# Patient Record
Sex: Female | Born: 1935 | Race: White | Hispanic: No | Marital: Married | Smoking: Never smoker
Health system: Southern US, Community
[De-identification: ages and names within clinical notes are randomized; demographics above are authoritative.]

## PROBLEM LIST (undated history)

## (undated) DIAGNOSIS — R011 Cardiac murmur, unspecified: Secondary | ICD-10-CM

## (undated) DIAGNOSIS — K219 Gastro-esophageal reflux disease without esophagitis: Secondary | ICD-10-CM

## (undated) DIAGNOSIS — I350 Nonrheumatic aortic (valve) stenosis: Secondary | ICD-10-CM

## (undated) DIAGNOSIS — I10 Essential (primary) hypertension: Secondary | ICD-10-CM

## (undated) DIAGNOSIS — R001 Bradycardia, unspecified: Secondary | ICD-10-CM

## (undated) DIAGNOSIS — H5440 Blindness, one eye, unspecified eye: Secondary | ICD-10-CM

## (undated) DIAGNOSIS — J45909 Unspecified asthma, uncomplicated: Secondary | ICD-10-CM

## (undated) DIAGNOSIS — M1710 Unilateral primary osteoarthritis, unspecified knee: Secondary | ICD-10-CM

## (undated) DIAGNOSIS — E119 Type 2 diabetes mellitus without complications: Secondary | ICD-10-CM

## (undated) DIAGNOSIS — J40 Bronchitis, not specified as acute or chronic: Secondary | ICD-10-CM

## (undated) HISTORY — DX: Bronchitis, not specified as acute or chronic: J40

## (undated) HISTORY — DX: Unilateral primary osteoarthritis, unspecified knee: M17.10

## (undated) HISTORY — DX: Bradycardia, unspecified: R00.1

## (undated) HISTORY — DX: Nonrheumatic aortic (valve) stenosis: I35.0

## (undated) HISTORY — PX: OTHER SURGICAL HISTORY: SHX169

## (undated) HISTORY — DX: Essential (primary) hypertension: I10

## (undated) HISTORY — PX: PACEMAKER INSERTION: SHX728

## (undated) HISTORY — DX: Unspecified asthma, uncomplicated: J45.909

## (undated) HISTORY — PX: CATARACT EXTRACTION: SUR2

## (undated) HISTORY — DX: Blindness, one eye, unspecified eye: H54.40

## (undated) HISTORY — DX: Gastro-esophageal reflux disease without esophagitis: K21.9

## (undated) HISTORY — DX: Cardiac murmur, unspecified: R01.1

## (undated) HISTORY — PX: DILATION AND CURETTAGE OF UTERUS: SHX78

## (undated) HISTORY — DX: Type 2 diabetes mellitus without complications: E11.9

---

## 1997-05-29 ENCOUNTER — Other Ambulatory Visit: Admission: RE | Admit: 1997-05-29 | Discharge: 1997-05-29 | Payer: Self-pay | Admitting: Gastroenterology

## 1999-01-20 ENCOUNTER — Other Ambulatory Visit: Admission: RE | Admit: 1999-01-20 | Discharge: 1999-01-20 | Payer: Self-pay | Admitting: Obstetrics and Gynecology

## 2000-05-14 ENCOUNTER — Other Ambulatory Visit: Admission: RE | Admit: 2000-05-14 | Discharge: 2000-05-14 | Payer: Self-pay | Admitting: Obstetrics and Gynecology

## 2002-02-07 ENCOUNTER — Encounter: Payer: Self-pay | Admitting: Orthopedic Surgery

## 2002-02-17 ENCOUNTER — Inpatient Hospital Stay (HOSPITAL_COMMUNITY): Admission: RE | Admit: 2002-02-17 | Discharge: 2002-02-20 | Payer: Self-pay | Admitting: Orthopedic Surgery

## 2002-02-20 ENCOUNTER — Inpatient Hospital Stay (HOSPITAL_COMMUNITY)
Admission: RE | Admit: 2002-02-20 | Discharge: 2002-03-06 | Payer: Self-pay | Admitting: Physical Medicine & Rehabilitation

## 2002-05-26 ENCOUNTER — Inpatient Hospital Stay (HOSPITAL_COMMUNITY): Admission: RE | Admit: 2002-05-26 | Discharge: 2002-05-27 | Payer: Self-pay | Admitting: Orthopedic Surgery

## 2004-02-15 ENCOUNTER — Ambulatory Visit: Payer: Self-pay | Admitting: Internal Medicine

## 2004-03-01 ENCOUNTER — Ambulatory Visit: Payer: Self-pay | Admitting: Internal Medicine

## 2004-03-02 ENCOUNTER — Ambulatory Visit: Payer: Self-pay | Admitting: Internal Medicine

## 2004-03-11 ENCOUNTER — Ambulatory Visit: Payer: Self-pay | Admitting: Internal Medicine

## 2004-03-25 ENCOUNTER — Ambulatory Visit: Payer: Self-pay | Admitting: Internal Medicine

## 2004-04-13 ENCOUNTER — Ambulatory Visit: Payer: Self-pay | Admitting: Internal Medicine

## 2004-04-28 ENCOUNTER — Ambulatory Visit: Payer: Self-pay | Admitting: Internal Medicine

## 2004-05-12 ENCOUNTER — Ambulatory Visit: Payer: Self-pay | Admitting: Internal Medicine

## 2004-05-26 ENCOUNTER — Ambulatory Visit: Payer: Self-pay | Admitting: Internal Medicine

## 2004-06-10 ENCOUNTER — Ambulatory Visit: Payer: Self-pay | Admitting: Internal Medicine

## 2004-06-29 ENCOUNTER — Ambulatory Visit: Payer: Self-pay | Admitting: Internal Medicine

## 2004-07-13 ENCOUNTER — Ambulatory Visit: Payer: Self-pay | Admitting: Internal Medicine

## 2004-07-27 ENCOUNTER — Ambulatory Visit: Payer: Self-pay | Admitting: Internal Medicine

## 2004-08-07 ENCOUNTER — Emergency Department (HOSPITAL_COMMUNITY): Admission: EM | Admit: 2004-08-07 | Discharge: 2004-08-07 | Payer: Self-pay | Admitting: Emergency Medicine

## 2004-08-12 ENCOUNTER — Ambulatory Visit: Payer: Self-pay | Admitting: Internal Medicine

## 2004-08-30 ENCOUNTER — Ambulatory Visit: Payer: Self-pay | Admitting: Internal Medicine

## 2004-09-13 ENCOUNTER — Ambulatory Visit: Payer: Self-pay | Admitting: Internal Medicine

## 2004-09-27 ENCOUNTER — Ambulatory Visit: Payer: Self-pay | Admitting: Internal Medicine

## 2004-10-12 ENCOUNTER — Other Ambulatory Visit: Admission: RE | Admit: 2004-10-12 | Discharge: 2004-10-12 | Payer: Self-pay | Admitting: Obstetrics and Gynecology

## 2004-10-17 ENCOUNTER — Ambulatory Visit: Payer: Self-pay | Admitting: Internal Medicine

## 2004-10-31 ENCOUNTER — Ambulatory Visit: Payer: Self-pay | Admitting: Internal Medicine

## 2004-11-02 ENCOUNTER — Ambulatory Visit: Payer: Self-pay | Admitting: Internal Medicine

## 2004-11-14 ENCOUNTER — Ambulatory Visit: Payer: Self-pay | Admitting: Internal Medicine

## 2004-12-06 ENCOUNTER — Ambulatory Visit: Payer: Self-pay | Admitting: Internal Medicine

## 2004-12-20 ENCOUNTER — Ambulatory Visit: Payer: Self-pay | Admitting: Internal Medicine

## 2005-01-03 ENCOUNTER — Ambulatory Visit: Payer: Self-pay | Admitting: Internal Medicine

## 2005-01-17 ENCOUNTER — Ambulatory Visit: Payer: Self-pay | Admitting: Internal Medicine

## 2005-01-31 ENCOUNTER — Ambulatory Visit: Payer: Self-pay | Admitting: Internal Medicine

## 2005-02-16 ENCOUNTER — Ambulatory Visit: Payer: Self-pay | Admitting: Emergency Medicine

## 2005-02-16 ENCOUNTER — Ambulatory Visit: Payer: Self-pay | Admitting: Internal Medicine

## 2005-02-21 ENCOUNTER — Ambulatory Visit: Payer: Self-pay | Admitting: Internal Medicine

## 2005-03-03 ENCOUNTER — Ambulatory Visit: Payer: Self-pay | Admitting: Internal Medicine

## 2005-03-24 ENCOUNTER — Ambulatory Visit: Payer: Self-pay | Admitting: Internal Medicine

## 2005-04-03 ENCOUNTER — Ambulatory Visit: Payer: Self-pay | Admitting: Pulmonary Disease

## 2005-04-03 ENCOUNTER — Ambulatory Visit: Payer: Self-pay | Admitting: Internal Medicine

## 2005-04-13 ENCOUNTER — Ambulatory Visit: Payer: Self-pay | Admitting: Internal Medicine

## 2005-04-26 ENCOUNTER — Ambulatory Visit: Payer: Self-pay | Admitting: Internal Medicine

## 2005-05-01 ENCOUNTER — Ambulatory Visit (HOSPITAL_COMMUNITY): Admission: RE | Admit: 2005-05-01 | Discharge: 2005-05-01 | Payer: Self-pay | Admitting: Gastroenterology

## 2005-05-02 ENCOUNTER — Encounter: Admission: RE | Admit: 2005-05-02 | Discharge: 2005-05-02 | Payer: Self-pay | Admitting: Gastroenterology

## 2005-05-05 ENCOUNTER — Ambulatory Visit: Payer: Self-pay

## 2005-05-15 ENCOUNTER — Ambulatory Visit: Payer: Self-pay | Admitting: Internal Medicine

## 2005-06-05 ENCOUNTER — Ambulatory Visit: Payer: Self-pay | Admitting: Internal Medicine

## 2005-06-23 ENCOUNTER — Ambulatory Visit: Payer: Self-pay | Admitting: Internal Medicine

## 2005-07-05 ENCOUNTER — Ambulatory Visit: Payer: Self-pay | Admitting: Internal Medicine

## 2005-07-25 ENCOUNTER — Ambulatory Visit: Payer: Self-pay | Admitting: Internal Medicine

## 2005-07-26 ENCOUNTER — Ambulatory Visit: Payer: Self-pay | Admitting: Internal Medicine

## 2005-08-07 ENCOUNTER — Ambulatory Visit: Payer: Self-pay | Admitting: Internal Medicine

## 2005-08-17 ENCOUNTER — Ambulatory Visit: Payer: Self-pay | Admitting: Internal Medicine

## 2005-09-05 ENCOUNTER — Ambulatory Visit: Payer: Self-pay | Admitting: Internal Medicine

## 2005-09-11 ENCOUNTER — Ambulatory Visit: Payer: Self-pay | Admitting: Internal Medicine

## 2005-09-27 ENCOUNTER — Ambulatory Visit: Payer: Self-pay | Admitting: Internal Medicine

## 2005-10-11 ENCOUNTER — Ambulatory Visit: Payer: Self-pay | Admitting: Internal Medicine

## 2005-10-24 ENCOUNTER — Ambulatory Visit: Payer: Self-pay | Admitting: Internal Medicine

## 2005-11-07 ENCOUNTER — Ambulatory Visit: Payer: Self-pay | Admitting: Internal Medicine

## 2005-11-21 ENCOUNTER — Ambulatory Visit: Payer: Self-pay | Admitting: Internal Medicine

## 2005-12-06 ENCOUNTER — Ambulatory Visit: Payer: Self-pay | Admitting: Internal Medicine

## 2005-12-21 ENCOUNTER — Ambulatory Visit: Payer: Self-pay | Admitting: Internal Medicine

## 2005-12-25 ENCOUNTER — Ambulatory Visit: Payer: Self-pay | Admitting: Pulmonary Disease

## 2006-01-04 ENCOUNTER — Ambulatory Visit: Payer: Self-pay | Admitting: Internal Medicine

## 2006-01-19 ENCOUNTER — Ambulatory Visit: Payer: Self-pay | Admitting: Internal Medicine

## 2006-01-25 ENCOUNTER — Ambulatory Visit: Payer: Self-pay | Admitting: Cardiology

## 2006-02-08 ENCOUNTER — Ambulatory Visit: Payer: Self-pay | Admitting: Internal Medicine

## 2006-02-20 ENCOUNTER — Ambulatory Visit: Payer: Self-pay | Admitting: Internal Medicine

## 2006-02-28 ENCOUNTER — Ambulatory Visit: Payer: Self-pay

## 2006-02-28 ENCOUNTER — Encounter: Payer: Self-pay | Admitting: Cardiovascular Disease

## 2006-03-12 ENCOUNTER — Ambulatory Visit: Payer: Self-pay | Admitting: Internal Medicine

## 2006-03-27 ENCOUNTER — Ambulatory Visit: Payer: Self-pay | Admitting: Internal Medicine

## 2006-04-04 ENCOUNTER — Ambulatory Visit: Payer: Self-pay | Admitting: Internal Medicine

## 2006-04-11 ENCOUNTER — Ambulatory Visit: Payer: Self-pay | Admitting: Internal Medicine

## 2006-04-25 ENCOUNTER — Ambulatory Visit: Payer: Self-pay | Admitting: Internal Medicine

## 2006-05-08 ENCOUNTER — Ambulatory Visit: Payer: Self-pay | Admitting: Internal Medicine

## 2006-05-25 ENCOUNTER — Ambulatory Visit: Payer: Self-pay | Admitting: Internal Medicine

## 2006-06-14 ENCOUNTER — Ambulatory Visit: Payer: Self-pay | Admitting: Internal Medicine

## 2006-06-27 ENCOUNTER — Ambulatory Visit: Payer: Self-pay | Admitting: Internal Medicine

## 2006-06-29 ENCOUNTER — Ambulatory Visit: Payer: Self-pay | Admitting: Internal Medicine

## 2006-07-13 ENCOUNTER — Ambulatory Visit: Payer: Self-pay | Admitting: Internal Medicine

## 2006-08-03 ENCOUNTER — Ambulatory Visit: Payer: Self-pay | Admitting: Internal Medicine

## 2006-08-17 ENCOUNTER — Ambulatory Visit: Payer: Self-pay | Admitting: Internal Medicine

## 2006-08-24 ENCOUNTER — Ambulatory Visit: Payer: Self-pay | Admitting: Internal Medicine

## 2006-09-10 ENCOUNTER — Ambulatory Visit: Payer: Self-pay | Admitting: Internal Medicine

## 2006-09-25 ENCOUNTER — Ambulatory Visit: Payer: Self-pay | Admitting: Internal Medicine

## 2006-10-11 ENCOUNTER — Ambulatory Visit: Payer: Self-pay | Admitting: Internal Medicine

## 2006-10-25 ENCOUNTER — Ambulatory Visit: Payer: Self-pay | Admitting: Internal Medicine

## 2006-11-06 ENCOUNTER — Ambulatory Visit: Payer: Self-pay | Admitting: Internal Medicine

## 2006-11-19 ENCOUNTER — Ambulatory Visit: Payer: Self-pay | Admitting: Internal Medicine

## 2006-12-05 ENCOUNTER — Ambulatory Visit: Payer: Self-pay | Admitting: Internal Medicine

## 2006-12-20 ENCOUNTER — Ambulatory Visit: Payer: Self-pay | Admitting: Internal Medicine

## 2007-01-11 ENCOUNTER — Ambulatory Visit: Payer: Self-pay | Admitting: Internal Medicine

## 2007-01-29 ENCOUNTER — Ambulatory Visit: Payer: Self-pay | Admitting: Internal Medicine

## 2007-02-13 ENCOUNTER — Ambulatory Visit: Payer: Self-pay | Admitting: Internal Medicine

## 2007-03-01 ENCOUNTER — Ambulatory Visit: Payer: Self-pay | Admitting: Internal Medicine

## 2007-03-14 ENCOUNTER — Ambulatory Visit: Payer: Self-pay | Admitting: Internal Medicine

## 2007-04-01 ENCOUNTER — Ambulatory Visit: Payer: Self-pay | Admitting: Internal Medicine

## 2007-04-24 ENCOUNTER — Ambulatory Visit: Payer: Self-pay | Admitting: Internal Medicine

## 2007-05-09 ENCOUNTER — Ambulatory Visit: Payer: Self-pay | Admitting: Internal Medicine

## 2007-05-29 ENCOUNTER — Ambulatory Visit: Payer: Self-pay | Admitting: Internal Medicine

## 2007-06-10 ENCOUNTER — Ambulatory Visit: Payer: Self-pay | Admitting: Internal Medicine

## 2007-06-11 ENCOUNTER — Telehealth (INDEPENDENT_AMBULATORY_CARE_PROVIDER_SITE_OTHER): Payer: Self-pay | Admitting: *Deleted

## 2007-06-13 ENCOUNTER — Encounter: Payer: Self-pay | Admitting: Internal Medicine

## 2007-06-13 DIAGNOSIS — J209 Acute bronchitis, unspecified: Secondary | ICD-10-CM

## 2007-06-13 DIAGNOSIS — E119 Type 2 diabetes mellitus without complications: Secondary | ICD-10-CM

## 2007-06-13 DIAGNOSIS — Z8679 Personal history of other diseases of the circulatory system: Secondary | ICD-10-CM | POA: Insufficient documentation

## 2007-06-13 DIAGNOSIS — K219 Gastro-esophageal reflux disease without esophagitis: Secondary | ICD-10-CM

## 2007-06-13 DIAGNOSIS — I10 Essential (primary) hypertension: Secondary | ICD-10-CM | POA: Insufficient documentation

## 2007-06-16 DIAGNOSIS — J42 Unspecified chronic bronchitis: Secondary | ICD-10-CM

## 2007-06-17 ENCOUNTER — Telehealth (INDEPENDENT_AMBULATORY_CARE_PROVIDER_SITE_OTHER): Payer: Self-pay | Admitting: *Deleted

## 2007-06-27 ENCOUNTER — Ambulatory Visit: Payer: Self-pay | Admitting: Internal Medicine

## 2007-06-28 ENCOUNTER — Telehealth: Payer: Self-pay | Admitting: Internal Medicine

## 2007-07-11 ENCOUNTER — Ambulatory Visit: Payer: Self-pay | Admitting: Internal Medicine

## 2007-07-31 ENCOUNTER — Ambulatory Visit: Payer: Self-pay | Admitting: Internal Medicine

## 2007-08-13 ENCOUNTER — Ambulatory Visit: Payer: Self-pay | Admitting: Internal Medicine

## 2007-09-03 ENCOUNTER — Ambulatory Visit: Payer: Self-pay | Admitting: Internal Medicine

## 2007-09-18 ENCOUNTER — Ambulatory Visit: Payer: Self-pay | Admitting: Internal Medicine

## 2007-10-03 ENCOUNTER — Ambulatory Visit: Payer: Self-pay | Admitting: Internal Medicine

## 2007-10-21 ENCOUNTER — Ambulatory Visit: Payer: Self-pay | Admitting: Internal Medicine

## 2007-11-11 ENCOUNTER — Ambulatory Visit: Payer: Self-pay | Admitting: Pulmonary Disease

## 2007-11-11 ENCOUNTER — Ambulatory Visit: Payer: Self-pay | Admitting: Internal Medicine

## 2007-11-18 ENCOUNTER — Ambulatory Visit: Payer: Self-pay | Admitting: Internal Medicine

## 2007-12-02 ENCOUNTER — Ambulatory Visit: Payer: Self-pay | Admitting: Internal Medicine

## 2007-12-10 ENCOUNTER — Ambulatory Visit: Payer: Self-pay | Admitting: Internal Medicine

## 2007-12-16 ENCOUNTER — Ambulatory Visit: Payer: Self-pay | Admitting: Internal Medicine

## 2007-12-30 ENCOUNTER — Ambulatory Visit: Payer: Self-pay | Admitting: Internal Medicine

## 2008-01-13 ENCOUNTER — Ambulatory Visit: Payer: Self-pay | Admitting: Internal Medicine

## 2008-01-19 ENCOUNTER — Encounter: Payer: Self-pay | Admitting: Internal Medicine

## 2008-01-27 ENCOUNTER — Ambulatory Visit: Payer: Self-pay | Admitting: Internal Medicine

## 2008-02-11 ENCOUNTER — Ambulatory Visit: Payer: Self-pay | Admitting: Internal Medicine

## 2008-02-25 ENCOUNTER — Ambulatory Visit: Payer: Self-pay | Admitting: Internal Medicine

## 2008-03-12 ENCOUNTER — Ambulatory Visit: Payer: Self-pay | Admitting: Internal Medicine

## 2008-03-26 ENCOUNTER — Ambulatory Visit: Payer: Self-pay | Admitting: Internal Medicine

## 2008-04-22 ENCOUNTER — Ambulatory Visit: Payer: Self-pay | Admitting: Internal Medicine

## 2008-05-06 ENCOUNTER — Ambulatory Visit: Payer: Self-pay | Admitting: Internal Medicine

## 2008-05-15 ENCOUNTER — Other Ambulatory Visit: Admission: RE | Admit: 2008-05-15 | Discharge: 2008-05-15 | Payer: Self-pay | Admitting: Gastroenterology

## 2008-05-21 ENCOUNTER — Ambulatory Visit: Payer: Self-pay | Admitting: Internal Medicine

## 2008-06-08 ENCOUNTER — Encounter: Payer: Self-pay | Admitting: Internal Medicine

## 2008-06-08 ENCOUNTER — Ambulatory Visit: Payer: Self-pay | Admitting: Internal Medicine

## 2008-06-21 ENCOUNTER — Encounter: Payer: Self-pay | Admitting: Internal Medicine

## 2008-07-07 ENCOUNTER — Ambulatory Visit: Payer: Self-pay | Admitting: Internal Medicine

## 2008-10-09 ENCOUNTER — Telehealth: Payer: Self-pay | Admitting: Internal Medicine

## 2008-12-07 ENCOUNTER — Ambulatory Visit: Payer: Self-pay | Admitting: Internal Medicine

## 2009-02-06 HISTORY — PX: INSERT / REPLACE / REMOVE PACEMAKER: SUR710

## 2009-07-02 ENCOUNTER — Ambulatory Visit: Payer: Self-pay | Admitting: Internal Medicine

## 2009-07-06 ENCOUNTER — Telehealth: Payer: Self-pay | Admitting: Internal Medicine

## 2009-07-06 ENCOUNTER — Ambulatory Visit: Payer: Self-pay | Admitting: Internal Medicine

## 2009-07-06 ENCOUNTER — Ambulatory Visit: Payer: Self-pay

## 2009-07-06 LAB — CONVERTED CEMR LAB
BUN: 20 mg/dL (ref 6–23)
CO2: 26 meq/L (ref 19–32)
Chloride: 101 meq/L (ref 96–112)
Creatinine, Ser: 1 mg/dL (ref 0.4–1.2)
Potassium: 4.5 meq/L (ref 3.5–5.1)
Sodium: 134 meq/L — ABNORMAL LOW (ref 135–145)

## 2009-07-07 ENCOUNTER — Telehealth: Payer: Self-pay | Admitting: Internal Medicine

## 2009-07-15 ENCOUNTER — Telehealth: Payer: Self-pay | Admitting: Internal Medicine

## 2009-07-15 ENCOUNTER — Ambulatory Visit: Payer: Self-pay | Admitting: Internal Medicine

## 2009-07-15 DIAGNOSIS — I2510 Atherosclerotic heart disease of native coronary artery without angina pectoris: Secondary | ICD-10-CM | POA: Insufficient documentation

## 2009-07-15 LAB — CONVERTED CEMR LAB
Basophils Absolute: 0 10*3/uL (ref 0.0–0.1)
Basophils Relative: 0.5 % (ref 0.0–3.0)
Eosinophils Relative: 0.4 % (ref 0.0–5.0)
HCT: 31 % — ABNORMAL LOW (ref 36.0–46.0)
Lymphs Abs: 1.9 10*3/uL (ref 0.7–4.0)
MCV: 82.8 fL (ref 78.0–100.0)
Monocytes Absolute: 0.8 10*3/uL (ref 0.1–1.0)
Neutro Abs: 6.2 10*3/uL (ref 1.4–7.7)
Platelets: 368 10*3/uL (ref 150.0–400.0)
WBC: 8.9 10*3/uL (ref 4.5–10.5)

## 2009-07-16 ENCOUNTER — Inpatient Hospital Stay (HOSPITAL_COMMUNITY): Admission: EM | Admit: 2009-07-16 | Discharge: 2009-07-21 | Payer: Self-pay | Admitting: Emergency Medicine

## 2009-07-16 ENCOUNTER — Encounter: Payer: Self-pay | Admitting: Cardiology

## 2009-07-16 ENCOUNTER — Ambulatory Visit: Payer: Self-pay | Admitting: Cardiology

## 2009-07-16 ENCOUNTER — Telehealth: Payer: Self-pay | Admitting: Internal Medicine

## 2009-07-20 ENCOUNTER — Encounter: Payer: Self-pay | Admitting: Cardiology

## 2009-07-21 ENCOUNTER — Encounter: Payer: Self-pay | Admitting: Cardiology

## 2009-07-22 ENCOUNTER — Encounter: Payer: Self-pay | Admitting: Cardiology

## 2009-07-29 ENCOUNTER — Encounter: Payer: Self-pay | Admitting: Internal Medicine

## 2009-08-02 ENCOUNTER — Ambulatory Visit: Payer: Self-pay

## 2009-08-02 ENCOUNTER — Encounter: Payer: Self-pay | Admitting: Cardiology

## 2009-08-17 ENCOUNTER — Telehealth: Payer: Self-pay | Admitting: Cardiology

## 2009-08-23 ENCOUNTER — Ambulatory Visit: Payer: Self-pay | Admitting: Cardiology

## 2009-08-23 DIAGNOSIS — Z950 Presence of cardiac pacemaker: Secondary | ICD-10-CM | POA: Insufficient documentation

## 2009-08-23 DIAGNOSIS — I5031 Acute diastolic (congestive) heart failure: Secondary | ICD-10-CM

## 2009-09-24 ENCOUNTER — Encounter: Payer: Self-pay | Admitting: Cardiology

## 2009-09-28 ENCOUNTER — Telehealth: Payer: Self-pay | Admitting: Cardiology

## 2009-10-19 ENCOUNTER — Telehealth (INDEPENDENT_AMBULATORY_CARE_PROVIDER_SITE_OTHER): Payer: Self-pay | Admitting: *Deleted

## 2009-10-21 ENCOUNTER — Ambulatory Visit: Payer: Self-pay | Admitting: Cardiology

## 2009-10-21 DIAGNOSIS — I359 Nonrheumatic aortic valve disorder, unspecified: Secondary | ICD-10-CM | POA: Insufficient documentation

## 2009-10-27 ENCOUNTER — Telehealth: Payer: Self-pay | Admitting: Cardiology

## 2009-11-12 ENCOUNTER — Encounter: Payer: Self-pay | Admitting: Internal Medicine

## 2010-02-08 ENCOUNTER — Encounter: Payer: Self-pay | Admitting: Cardiology

## 2010-02-08 ENCOUNTER — Ambulatory Visit
Admission: RE | Admit: 2010-02-08 | Discharge: 2010-02-08 | Payer: Self-pay | Source: Home / Self Care | Attending: Cardiology | Admitting: Cardiology

## 2010-03-08 NOTE — Progress Notes (Signed)
Summary: refill on nebulizer med  Phone Note Call from Patient Call back at [phone]   Caller: Patient Call For: Dr. Maple Hudson Summary of Call: [phone] Pt called to review her appts for today. CT Chest PE Protocol scheduled today at [facility] at 1:30, to be followed by the Bilateral Venous Doppler at 2:30. Pt also advised that she is out of her nebulizer medication which she gets from [facility]. Need new Rx sent to [facility]. Please advise Initial call taken by: Alfonso Ramus,  Jul 06, 2009 10:47 AM  Follow-up for Phone Call        [facility].Carron Curie CMA  Jul 06, 2009 11:18 AM   CDY; can we please give RX for this for pt.Thanks.Reynaldo Minium CMA  Jul 06, 2009 5:05 PM     Prescriptions: XOPENEX HOME NEB 1.25 MG USE TID PRN three times a day as needed  #50 x prn   Entered and Authorized by:   Waymon Budge MD   Signed by:   Waymon Budge MD on 07/06/2009   Method used:   Print then Give to Patient   RxID:   [id number]   Appended Document: refill on nebulizer med [facility] or any home care companies do not supply the xopenex neb meds any longer. This must be sent to pt's local pharmacy.

## 2010-03-08 NOTE — Letter (Signed)
Summary: CMN for Nebulizer/[facility]  CMN for Nebulizer/[facility]   Imported By: Sherian Rein 08/11/2009 13:41:34  _____________________________________________________________________  External Attachment:    Type:   Image     Comment:   External Document

## 2010-03-08 NOTE — Progress Notes (Signed)
Summary: meds  Phone Note Call from Patient Call back at Home Phone [phone]   Caller: Patient Reason for Call: Talk to Nurse Summary of Call: pt experiencing sob - wants to know if Neb Meds have been ordered? Initial call taken by: Eugene Gavia,  July 07, 2009 1:58 PM  Follow-up for Phone Call        looks like nebs need to go through pt localpharmacy. pt requests it be sent to [facility]. Pt also states she does not think she has any tubing to go with her neb machine so she requests an order for this be sent to [facility]. order placed. pt aware. Carron Curie CMA  July 07, 2009 2:40 PM     New/Updated Medications: XOPENEX 1.25 MG/3ML NEBU (LEVALBUTEROL HCL) three times a day as needed Prescriptions: XOPENEX 1.25 MG/3ML NEBU (LEVALBUTEROL HCL) three times a day as needed  #50 x prn   Entered by:   Carron Curie CMA   Authorized by:   Waymon Budge MD   Signed by:   Carron Curie CMA on 07/07/2009   Method used:   Electronically to        [facility]* (retail)       [address] [place], [place]  [address]       Ph: [phone] or [phone]       Fax: [phone]   RxID:   [id number]

## 2010-03-08 NOTE — Progress Notes (Signed)
Summary: I called to check and discuss labs  Phone Note Other Incoming   Summary of Call: I called to f/u. She notes some ongong dyspnea, not dramatic. Dr Pete Glatter did some more lab work and she will hear about that on the 13th. She will call as needed. Initial call taken by: Waymon Budge MD,  July 07, 2009 3:54 PM

## 2010-03-08 NOTE — Progress Notes (Signed)
Summary: returning call to dr young  Phone Note Call from Patient Call back at [facility] Phone [phone]   Caller: Patient Call For: young Summary of Call: returning a call back to dr young Initial call taken by: Lacinda Axon,  July 15, 2009 3:14 PM  Follow-up for Phone Call        Pt states she received a call form Dr. Maple Hudson after see saw hm for appt today. She staets she will be home for rest of evening so he can reach her at home number. Carron Curie CMA  July 15, 2009 3:17 PM  Follow-up by: Geanie Kenning  Additional Follow-up for Phone Call Additional follow up Details #1::        patient wold like Dr Maple Hudson to call her back at home at [phone] Additional Follow-up by: Denna Haggard, CMA,  July 15, 2009 5:15 PM    Additional Follow-up for Phone Call Additional follow up Details #2::    I had called her a bout heart rate and lab. She had a heart monitor through Dr Pete Glatter about a month ago. I note now that BP is ok,  but HR today wa 36 by nurse, 50 by me. On 5/27 was 48. On labs, she is anemic with Hg10.6.  BNP was high 847, c/w CHF. She is ok at home tonight with no transportation to go get diuretic if offered. She had seen Dr Elease Hashimoto a couple of yers ago, but has no active cardilogy f/u. Plan-I told her to stop Norvasc now- for heart rate. I will contact Dr Pete Glatter tomorrow and see if he wants to see her, should she be admitted for  pacer assessment etc. She expressed satisfaction with this approach. She will go to ER tonight if needed.  Follow-up by: Waymon Budge MD,  July 15, 2009 5:53 PM

## 2010-03-08 NOTE — Miscellaneous (Signed)
Summary: Device preload  Clinical Lists Changes  Observations: Added new observation of PPM INDICATN: Syncope (07/22/2009 13:28) Added new observation of MAGNET RTE: BOL 100 ERI 85 (07/22/2009 13:28) Added new observation of PPMLEADSTAT2: active (07/22/2009 13:28) Added new observation of PPMLEADSER2: [id number] (07/22/2009 13:28) Added new observation of PPMLEADMOD2: 2088TC (07/22/2009 13:28) Added new observation of PPMLEADLOC2: RV (07/22/2009 13:28) Added new observation of PPMLEADSTAT1: active (07/22/2009 13:28) Added new observation of PPMLEADSER1: [id number] (07/22/2009 13:28) Added new observation of PPMLEADMOD1: 2088TC (07/22/2009 13:28) Added new observation of PPMLEADLOC1: RA (07/22/2009 13:28) Added new observation of PPM IMP MD: Everardo Beals. Juanda Chance, MD (07/22/2009 13:28) Added new observation of PPMLEADDOI2: 07/20/2009 (07/22/2009 13:28) Added new observation of PPMLEADDOI1: 07/20/2009 (07/22/2009 13:28) Added new observation of PPM DOI: 07/20/2009 (07/22/2009 13:28) Added new observation of PPM SERL#: [id number]  (07/22/2009 13:28) Added new observation of PPM MODL#: [id number]  (07/22/2009 44:01) Added new observation of PACEMAKERMFG: [employer] Jude  (07/22/2009 13:28) Added new observation of PACEMAKER MD: Everardo Beals. Juanda Chance, MD  (07/22/2009 13:28)      PPM Specifications Following MD:  Everardo Beals. Juanda Chance, MD     PPM Vendor:  [employer] Jude     PPM Model Number:  [id number]     PPM Serial Number:  [id number] PPM DOI:  07/20/2009     PPM Implanting MD:  Everardo Beals. Juanda Chance, MD  Lead 1    Location: RA     DOI: 07/20/2009     Model #: [id number]     Serial #: [id number]     Status: active Lead 2    Location: RV     DOI: 07/20/2009     Model #: [id number]     Serial #: [id number]     Status: active  Magnet Response Rate:  BOL 100 ERI 85  Indications:  Syncope

## 2010-03-08 NOTE — Progress Notes (Signed)
Summary: pt having surgery dose she need to be seen  Phone Note Call from Patient Call back at Home Phone [phone]   Caller: Patient Reason for Call: Talk to Doctor, Lab or Test Results Summary of Call: pt having surgery on the 19th and wants to know if Dr. Juanda Chance wanted to see her sooner to have her pacer checked or what dose she need to do Initial call taken by: Omer Jack,  September 28, 2009 1:32 PM  Follow-up for Phone Call        I called and spoke with the pt. I made her aware that we cannot see her back prior to 9/15 due to the fact that that would be sooner than 3 months from implant date of her PPM on 6/15. She is scheduled for eye surgery on 9/19 with the [facility] ( Dr. Gwendalyn Ege). I will call them to get details of her procedure and then discuss with Dr. Juanda Chance on 8/25. The office # for Dr. Gwendalyn Ege is [phone]. Follow-up by: Sherri Rad, RN, BSN,  September 28, 2009 4:27 PM  Additional Follow-up for Phone Call Additional follow up Details #1::        I attempted to call Dr. Baxter Hire office for the details of the pt's surgery. Busy x 2 tries. Sherri Rad, RN, BSN  September 30, 2009 11:50 AM   Pt calling regarding appt Judie Grieve  October 04, 2009 9:42 AM  I attempted to call Dr. Baxter Hire office. I was put on hold and then cut off. I called the pt and made her aware that Dr. Juanda Chance wanted to see her in the office prior to her eye surgery. I explained I am trying to work him in the office the week on 9/12. I will call her back when I have this worked out. She is agreeable. She states she has 3 appts. on 9/13 at [facility]- appts. are at 11am/ 1pm/ & 3pm. She also states she saw her PCP recently and he stopped her hydralazine. She is to f/u with him on 9/6. Additional Follow-up by: Sherri Rad, RN, BSN,  October 04, 2009 4:07 PM    Additional Follow-up for Phone Call Additional follow up Details #2::    I attempted to call Dr. Baxter Hire office back. The office is now  closed I will c/b on wednsday when I come back to the office. Sherri Rad, RN, BSN  October 04, 2009 4:34 PM  I spoke with  Dr. Baxter Hire office. The pt is having retina surgery and an endo laser to her eye. Anesthesia is for general mask only. They will not know until the day of her surgery if she will need to stay over night.  Follow-up by: Sherri Rad, RN, BSN,  October 06, 2009 3:46 PM  Additional Follow-up for Phone Call Additional follow up Details #3:: Details for Additional Follow-up Action Taken: I spoke with the pt. She will come in on 9/15 for an appt. with Dr. Juanda Chance. Additional Follow-up by: Sherri Rad, RN, BSN,  October 08, 2009 4:40 PM

## 2010-03-08 NOTE — Miscellaneous (Signed)
Summary: Injection Orders / [facility]    Injection Orders /  Allergy    Imported By: Lennie Odor 07/06/2009 15:58:39  _____________________________________________________________________  External Attachment:    Type:   Image     Comment:   External Document

## 2010-03-08 NOTE — Progress Notes (Signed)
Summary: pharm calling re: xopenex  Phone Note From Pharmacy   Caller: courtney at [facility] Call For: Annette Lyons  Summary of Call: re: rx for XOPENEX. can this be subbed for "plain albuterol" or something cheaper? call [phone] Initial call taken by: Tivis Ringer, CNA,  July 07, 2009 5:05 PM  Follow-up for Phone Call        xopenex is too expensive for the pt, she wants to know can it be changed to albuterol neb. Please advise. Carron Curie CMA  July 07, 2009 5:17 PM allergies: pcn  Additional Follow-up for Phone Call Additional follow up Details #1::        Script for albuterol neb solution sent to drug store. Please let her know. Additional Follow-up by: Waymon Budge MD,  July 07, 2009 5:33 PM    Additional Follow-up for Phone Call Additional follow up Details #2::    pt aware. Carron Curie CMA  July 07, 2009 5:36 PM   New/Updated Medications: ALBUTEROL SULFATE (2.5 MG/3ML) 0.083% NEBU (ALBUTEROL SULFATE) 1 neb four times a day as needed Prescriptions: ALBUTEROL SULFATE (2.5 MG/3ML) 0.083% NEBU (ALBUTEROL SULFATE) 1 neb four times a day as needed  #50 x prn   Entered and Authorized by:   Waymon Budge MD   Signed by:   Waymon Budge MD on 07/07/2009   Method used:   Electronically to        [facility]* (retail)       [address] [place], [place]  [address]       Ph: [phone] or [phone]       Fax: [phone]   RxID:   [id number]

## 2010-03-08 NOTE — Miscellaneous (Signed)
Summary: Orders Update  Clinical Lists Changes  Orders: Added new Test order of Venous Duplex Lower Extremity (Venous Duplex Lower) - Signed 

## 2010-03-08 NOTE — Cardiovascular Report (Signed)
Summary: Office Visit  Office Visit   Imported By: Marylou Mccoy 10/01/2009 10:31:20  _____________________________________________________________________  External Attachment:    Type:   Image     Comment:   External Document

## 2010-03-08 NOTE — Progress Notes (Signed)
Summary: Concern possible PE  Phone Note Outgoing Call   Summary of Call: I called Mrs Coberly and told her my concern about possible PE. She remains dyspneic. She understands we are ordering lab for renal function, leg vein dopplers and CT. I am calling Dr Pete Glatter because she has appt there this afternoon.

## 2010-03-08 NOTE — Assessment & Plan Note (Signed)
Summary: rov   Referring Provider:  Dr. Robley Fries Primary Provider:  Dr. Pete Glatter   History of Present Illness: The. patient is 32 years and then returned for preop evaluation prior to eye surgery by Dr. Gwendalyn Ege at [facility] and for management of her pacemaker. on July 21, 2005 she presented with syncope and second degree AV block. She underwent implantation of a DDD pacemaker. She has done well since that time and has had no recurrent syncope and no palpitations. She has been told that her heart rate is fast.  She has a history of diastolic heart failure with an ejection fraction of 65%. She also has a history of aortic stenosis. She has diabetes hypertension and hyperlipidemia and asthma which is a doctor. She also has had insufficiency with creatinines in the range of 1.4.  She had an echocardiogram in the hospital in June which showed a peak velocity of 376, a mean aortic valve gradient of 27 mm, and a peak aortic valve gradient of 57 mm.  Current Medications (verified): 1)  Prilosec 20 Mg Cpdr (Omeprazole) .... Take One Capsule Once Daily 2)  Zetia 10 Mg  Tabs (Ezetimibe) .... Once Daily 3)  Diovan 320 Mg  Tabs (Valsartan) .... Once Daily 4)  Metformin Hcl 1000 Mg Tabs (Metformin Hcl) .... Take 1 By Mouth Two Times A Day 5)  Acetaminophen 325 Mg  Tabs (Acetaminophen) .... As Needed 6)  Furosemide 20 Mg Tabs (Furosemide) .Marland Kitchen.. 1 By Mouth Daily 7)  Brimonidine Tartrate 0.2 % Soln (Brimonidine Tartrate) .... Uad 8)  Lutein 40 Mg Caps (Lutein) .Marland Kitchen.. 1 Poo Daily 9)  Vitamin B-12 1000 Mcg/32ml Liqd (Cyanocobalamin) .... Injection  Allergies (verified): 1)  ! Pcn  Past History:  Past Medical History: Last updated: 08/23/2009 DM (ICD-250.00) HEART MURMUR, HX OF (ICD-V12.50) GASTROESOPHAGEAL REFLUX DISEASE (ICD-530.81) HYPERTENSION (ICD-401.9) ASTHMATIC BRONCHITIS, ACUTE (ICD-466.0) BRONCHITIS (ICD-490) Heart failure with a preserved ejection fraction Bradycardia status  post pacemaker placement  Review of Systems       ROS is negative except as outlined in HPI.   Physical Exam  Additional Exam:  Gen. Well-nourished, in no distress   Neck: No JVD, thyroid not enlarged, no carotid bruits Lungs: No tachypnea, clear without rales, rhonchi or wheezes Cardiovascular: Rhythm regular, PMI not displaced,  heart sounds  normal, grade 2/6 harsh systolic murmur at the left leg, no peripheral edema, pulses normal in all 4 extremities. Abdomen: BS normal, abdomen soft and non-tender without masses or organomegaly, no hepatosplenomegaly. MS: No deformities, no cyanosis or clubbing   Neuro:  No focal sns   Skin:  no lesions  The pacer site was well healed   PPM Specifications Following MD:  Everardo Beals. Juanda Chance, MD     PPM Vendor:  [employer] Jude     PPM Model Number:  [id number]     PPM Serial Number:  [id number] PPM DOI:  07/20/2009     PPM Implanting MD:  Everardo Beals. Juanda Chance, MD  Lead 1    Location: RA     DOI: 07/20/2009     Model #: [id number]     Serial #: [id number]     Status: active Lead 2    Location: RV     DOI: 07/20/2009     Model #: [id number]     Serial #: [id number]     Status: active  Magnet Response Rate:  BOL 100 ERI 85  Indications:  Syncope   PPM Follow Up Battery Voltage:  2.96 V  Battery Est. Longevity:  9.0-9.4 yrs       PPM Device Measurements [facility]rium  Amplitude: 3.0 mV, Impedance: 510 ohms, Threshold: 0.625 V at 0.4 msec Right Ventricle  Amplitude: 6.8 mV, Impedance: 450 ohms, Threshold: 1.0 V at 0.4 msec  Episodes MS Episodes:  2538     Percent Mode Switch:  1%     Ventricular High Rate:  0     Atrial Pacing:  5.4%     Ventricular Pacing:  87%  Parameters Mode:  DDD     Lower Rate Limit:  60     Upper Rate Limit:  110 Paced AV Delay:  180     Sensed AV Delay:  160 Next Cardiology Appt Due:  07/08/2010 Tech Comments:  2538 AMS EPISODES--LONGEST EPISODE WAS 42 MINUTES 36 SECONDS. NORMAL DEVICE FUNCTION.  CHANGED RA SENSITIVITY FROM 0.5 TO 0.37mV AND RV  SENSITIVITY FROM 2.0 TO 1.38mV.  ROV IN JUNE 2012 W/BB. Vella Kohler  October 21, 2009 11:25 AM  Impression & Recommendations:  Problem # 1:  PACEMAKER, PERMANENT (ICD-V45.01) We interrogated  her pacemaker and she had good thresholds. Her rates were fairly fast and appear to be ST  Problem # 2:  ACUTE DIASTOLIC HEART FAILURE (ICD-428.31) she appears euvolemic today. Her updated medication list for this problem includes:    Diovan 320 Mg Tabs (Valsartan) ..... Once daily    Furosemide 20 Mg Tabs (Furosemide) .Marland Kitchen... 1 by mouth daily    Metoprolol Succinate 25 Mg Xr24h-tab (Metoprolol succinate) .Marland Kitchen... Take one tablet by mouth daily  Problem # 3:  HYPERTENSION (ICD-401.9) Controlled on current medications The following medications were removed from the medication list:    Hydralazine Hcl 25 Mg Tabs (Hydralazine hcl) .Marland Kitchen... Take 1 tablet by mouth three times a day Her updated medication list for this problem includes:    Diovan 320 Mg Tabs (Valsartan) ..... Once daily    Furosemide 20 Mg Tabs (Furosemide) .Marland Kitchen... 1 by mouth daily    Metoprolol Succinate 25 Mg Xr24h-tab (Metoprolol succinate) .Marland Kitchen... Take one tablet by mouth daily  Problem # 4:  AORTIC STENOSIS/ INSUFFICIENCY, NON-RHEUMATIC (ICD-424.1) she has moderate aortic stenosis with an aortic valve gradient of 27 mm mean. Her updated medication list for this problem includes:    Diovan 320 Mg Tabs (Valsartan) ..... Once daily    Furosemide 20 Mg Tabs (Furosemide) .Marland Kitchen... 1 by mouth daily    Metoprolol Succinate 25 Mg Xr24h-tab (Metoprolol succinate) .Marland Kitchen... Take one tablet by mouth daily  Problem # 5:  PREOPERATIVE EXAMINATION (ICD-V72.84) Sis scheduled for eye surgery on Monday and I thinks this is to be done under general anesthesia. Her risk of general anesthesia and a slightly increased and her age and her aortic valve disease but these are stable and I think it is reasonable to proceed. She has a pacemaker and is partially patient  dependen.t she should have a magnet present at  the time of surgery should there be any inhibition of the pacemaker didn't to the surgical equipment.  Other Orders: EKG w/ Interpretation (93000)  Patient Instructions: 1)  Start Toprol XL (metoprolol succ) 25mg  once daily. 2)  You are being cleared for your eye surgery. 3)  Followup as scheduled with Dr. Antoine Poche on tues. 02/08/10 @ 10:00. Prescriptions: METOPROLOL SUCCINATE 25 MG XR24H-TAB (METOPROLOL SUCCINATE) Take one tablet by mouth daily  #30 x 6   Entered by:   Sherri Rad, RN, BSN   Authorized by:   Lenoria Farrier, MD,  FACC   Signed by:   Sherri Rad, RN, BSN on 10/21/2009   Method used:   Electronically to        [facility]* (retail)       [address] [place], [place]  [address]       Ph: [phone] or [phone]       Fax: [phone]   RxID:   [id number]

## 2010-03-08 NOTE — Assessment & Plan Note (Signed)
Summary: STILL SOB/OKAY PER [facility]   Primary Provider/Referring Provider:  Jeanette Caprice  CC:  Accute visit-Increased SOB and cough-non productive/dry x 2-3 weeks; weak.Marland Kitchen  History of Present Illness: December 07, 2008- Asthma/ COPD Cough x 3 days. Feels "full in trhoat" with non[productive cough she describes as like a spell of asthma coming on. There is no fever, sore throat, phlegm, chest pain or palpitation. Not hungry, but not losing weight. Asks refill cough syrup. She brings neb meds out of date- not using. She asks for a neb here today.  Allergy vaccine is getting harder for her logistically. We discussed stopping when she runs out and observing as time goes on to see how she does without.  Jul 02, 2009- Asthma/ COPD Got through winter ok. 4-5 weeks ago she had dizzy spells and passed out twice. Wore heart monitior- ok. labs ok. Then left eye matted, puffy. Eye doctor told her allergy, but he also recommended laser eye surgery. Better, but still feels "touchy" left face. Blowing nose white. Ears ok. Lexapro  made her drowsy. Coughing spells. Wakes strangled- uses extra pillow. Tires easily walking across room x 3-4 weeks. The falling hurt right leg- seeing Dr Despina Hick. Heavy legs- can't tell if more than usual.  Wheezed last night. Denies reflux. Not driving.  July 15, 2009- Asthma/ COPD ......................Marland Kitchenfriend here visiting from [place] Arrives in wheelchair for acute visit because of  nonproductive cough, easy dyspnea, weakness and malaise. Our workup was negative for PE but CT did show CAD and small effusions. She complains of nausea, nervousness  and weakness, not pain. Not definite fever. Denies blood loss or swollen glands, syncope, palpitation. Has been seeing Eye doctors for [employer] loss.    Current Medications (verified): 1)  Ventolin Hfa 108 (90 Base) Mcg/act  Aers (Albuterol Sulfate) .... As Needed 2)  Prilosec 10 Mg  Cpdr (Omeprazole) .... Once Daily 3)   Norvasc 10 Mg  Tabs (Amlodipine Besylate) .... Once Daily 4)  Zetia 10 Mg  Tabs (Ezetimibe) .... Once Daily 5)  Diovan 320 Mg  Tabs (Valsartan) .... Once Daily 6)  Metformin Hcl 1000 Mg Tabs (Metformin Hcl) .... Take 1 By Mouth Two Times A Day 7)  Preservision/lutein  Caps (Multiple Vitamins-Minerals) .... Take 1 By Mouth Once Daily 8)  Albuterol Sulfate (2.5 Mg/72ml) 0.083% Nebu (Albuterol Sulfate) .Marland Kitchen.. 1 Neb Four Times A Day As Needed  Allergies (verified): 1)  ! Pcn  Past History:  Past Medical History: Last updated: 06/13/2007 DM (ICD-250.00) HEART MURMUR, HX OF (ICD-V12.50) GASTROESOPHAGEAL REFLUX DISEASE (ICD-530.81) HYPERTENSION (ICD-401.9) ASTHMATIC BRONCHITIS, ACUTE (ICD-466.0) BRONCHITIS (ICD-490)  Past Surgical History: Last updated: Jun 19, 2008 D&C Right knee replacement  Family History: Last updated: 2008/06/19 Father- died cancer esophagus Mother- died old age  Social History: Last updated: 07/02/2009 husband had cancer of the esophagus.-died 06-29-2007 Patient never smoked.  Widowed  Risk Factors: Smoking Status: never (07/02/2009)  Review of Systems      See HPI       The patient complains of shortness of breath with activity and non-productive cough.  The patient denies shortness of breath at rest, productive cough, coughing up blood, chest pain, irregular heartbeats, acid heartburn, indigestion, loss of appetite, weight change, abdominal pain, difficulty swallowing, sore throat, tooth/dental problems, headaches, nasal congestion/difficulty breathing through nose, sneezing, itching, ear ache, anxiety, depression, hand/feet swelling, joint stiffness or pain, rash, change in color of mucus, and fever.    Vital Signs:  Patient profile:   77 year old  female Height:      63 inches Weight:      172 pounds BMI:     30.58 O2 Sat:      96 % on Room air Pulse rate:   36 / minute BP sitting:   130 / 66  (left arm) Cuff size:   regular  Vitals Entered By: Reynaldo Minium CMA (July 15, 2009 11:37 AM)  O2 Flow:  Room air  Physical Exam  Additional Exam:  General: A/Ox3; pleasant and cooperative, NAD, obese, subdued affect, weak SKIN: no rash, lesions NODES: no lymphadenopathy HEENT: [place]/AT, EOM- WNL, Conjuctivae- clear, PERRLA, TM-WNL, Nose- clear, Throat- clear and wnl, Mallampati  II, dentures NECK: Supple w/ fair ROM, JVD- none, normal carotid impulses w/o bruits Thyroid- CHEST: Persistent dry cough w/out wheeze or dullness HEART: RRR, 2/6 systolic murmur AS- radiating into neck. HR is slow today- 50 by my count, regular. ABDOMEN: obese EAV:WUJW, nl pulses, no edema , heavy legs.1 + edema NEURO: Grossly intact to observation      Impression & Recommendations:  Problem # 1:  ? of PULMONARY EMBOLISM (ICD-415.19)  DVT/ PE not seen on CT or dopplers.  Problem # 2:  BRONCHITIS (ICD-490)  Her nebulizer makes her nervous. I suggested she split doses to reduce the stimulation. We ill give neb and depo today. Discussed her blood sugar with depo.  The following medications were removed from the medication list:    Promethazine Vc/codeine 6.25-5-10 Mg/64ml Syrp (Phenyleph-promethazine-cod) .Marland Kitchen... 1 teaspoon four times a day as needed cough Her updated medication list for this problem includes:    Ventolin Hfa 108 (90 Base) Mcg/act Aers (Albuterol sulfate) .Marland Kitchen... As needed    Albuterol Sulfate (2.5 Mg/38ml) 0.083% Nebu (Albuterol sulfate) .Marland Kitchen... 1 neb four times a day as needed  Problem # 3:  CAD (ICD-414.00)  CAD was seen on her CT. Together with her peripheral edema and slow pulse  this suggests weakness and cough may be cardiogenic with mild interstial edema.  We will check CBC for anemia, BNP for ? CHF and defer broad medical conplaints to Dr Pete Glatter who will see her Monday. On review at note completion, i wonder if malaise is due to bradycardia. iIwill have her reduce Norvasc to 1/2 x 10 mg daily. She will need to start thinkng about an  assisted living type option so she isn't trying to live alone.  I got no answer just now to call to her home, but left call back message on her answering machine. Her updated medication list for this problem includes:    Norvasc 10 Mg Tabs (Amlodipine besylate) ..... Once daily    Diovan 320 Mg Tabs (Valsartan) ..... Once daily  Orders: Est. Patient Level IV (11914) TLB-CBC Platelet - w/Differential (85025-CBCD) TLB-BNP (B-Natriuretic Peptide) (83880-BNPR)  Problem # 4:  BRONCHITIS (ICD-490)  Other Orders: Xopenex 1.25mg  (N8295)  Patient Instructions: 1)  Please schedule a follow-up appointment in 1 month. 2)  lab 3)  neb xop 1.25 4)  depo 80 5)  Keep appointment with Dr Pete Glatter      Medication Administration  Injection # 3:    Medication: Depo- Medrol 80mg     Diagnosis: BRONCHITIS (ICD-490)    Route: IM    Site: RUOQ gluteus    Exp Date: 10/07/2009    Lot #: [id number]    Mfr: [email or web address]    Patient tolerated injection without complications    Given by: Denna Haggard, CMA (July 15, 2009 12:39 PM)  Medication # 1:    Medication: Xopenex 1.25mg     Diagnosis: ASTHMATIC BRONCHITIS, ACUTE (ICD-466.0)    Dose: 1    Route: po    Exp Date: 10/07/2009    Lot #: [id number]    Mfr: [email or web address]    Patient tolerated medication without complications    Given by: Denna Haggard, CMA (July 15, 2009 12:41 PM)  Orders Added: 1)  Est. Patient Level IV [91478] 2)  TLB-CBC Platelet - w/Differential [85025-CBCD] 3)  TLB-BNP (B-Natriuretic Peptide) [83880-BNPR] 4)  Xopenex 1.25mg  [G9562]

## 2010-03-08 NOTE — Letter (Signed)
Summary: [facility] Medical Surgical Clearance   [facility] Medical Surgical Clearance   Imported By: Roderic Ovens 10/18/2009 15:45:23  _____________________________________________________________________  External Attachment:    Type:   Image     Comment:   External Document

## 2010-03-08 NOTE — Cardiovascular Report (Signed)
Summary: Office Visit   Office Visit   Imported By: Roderic Ovens 11/05/2009 12:44:54  _____________________________________________________________________  External Attachment:    Type:   Image     Comment:   External Document

## 2010-03-08 NOTE — Cardiovascular Report (Signed)
Summary: Implantable Device Registration   Implantable Device Registration   Imported By: Roderic Ovens 08/06/2009 09:34:39  _____________________________________________________________________  External Attachment:    Type:   Image     Comment:   External Document

## 2010-03-08 NOTE — Procedures (Signed)
Summary: wound check/jml   Current Medications (verified): 1)  Ventolin Hfa 108 (90 Base) Mcg/act  Aers (Albuterol Sulfate) .... As Needed 2)  Prilosec 10 Mg  Cpdr (Omeprazole) .... Once Daily 3)  Zetia 10 Mg  Tabs (Ezetimibe) .... Once Daily 4)  Diovan 320 Mg  Tabs (Valsartan) .... Once Daily 5)  Metformin Hcl 1000 Mg Tabs (Metformin Hcl) .... Take 1 By Mouth Two Times A Day 6)  Preservision/lutein  Caps (Multiple Vitamins-Minerals) .... Take 1 By Mouth Once Daily 7)  Albuterol Sulfate (2.5 Mg/44ml) 0.083% Nebu (Albuterol Sulfate) .Marland Kitchen.. 1 Neb Four Times A Day As Needed 8)  Hydralazine Hcl 25 Mg Tabs (Hydralazine Hcl) .... Take 1 Tablet By Mouth Three Times A Day  Allergies (verified): 1)  ! Pcn  PPM Specifications Following MD:  Everardo Beals. Juanda Chance, MD     PPM Vendor:  [employer]     PPM Model Number:  [id number]     PPM Serial Number:  [id number] PPM DOI:  07/20/2009     PPM Implanting MD:  Everardo Beals. Juanda Chance, MD  Lead 1    Location: RA     DOI: 07/20/2009     Model #: [id number]     Serial #: [id number]     Status: active Lead 2    Location: RV     DOI: 07/20/2009     Model #: [id number]     Serial #: [id number]     Status: active  Magnet Response Rate:  BOL 100 ERI 85  Indications:  Syncope   PPM Follow Up Battery Voltage:  3.14 V     Battery Est. Longevity:  11.4-11.57yrs       PPM Device Measurements [employer]rium  Amplitude: 3.1 mV, Impedance: 490 ohms, Threshold: 0.625 V at 0.4 msec Right Ventricle  Amplitude: 10.3 mV, Impedance: 480 ohms, Threshold: 1.0 V at 0.4 msec  Episodes MS Episodes:  201     Percent Mode Switch:  <1%     Ventricular High Rate:  0     Atrial Pacing:  1.3     Ventricular Pacing:  89%  Parameters Mode:  DDD     Lower Rate Limit:  60     Upper Rate Limit:  110 Paced AV Delay:  180     Sensed AV Delay:  160 Next Cardiology Appt Due:  10/07/2009 Tech Comments:  201 MODE SWITCHES--LONGEST WAS 6 MINUTES.  MOST MODE SWITCHES PACs.  NORMAL DEVICE FUNCTION. TURNED RATE RESPONSE ON DURING  MODE SWITCH.  ROV IN 3 MTHS. Vella Kohler  August 03, 2009 1:05 PM

## 2010-03-08 NOTE — Assessment & Plan Note (Signed)
Summary: stuffy head/headache/cb   Primary Provider/Referring Provider:  Jeanette Caprice  CC:  Left sided head and eye pain(?allergies); SOB, dizzy spells, and headache; passed out 2-3 weeks ago; would like to discuss meds. .  History of Present Illness: 12/10/07- Asthma/COPD Had flu vax. Discussed cough and cough syrup, reviewed meds.Denies change in cough, pain, palpitation, bleeding.  07-01-2008- Asthma/ COPD  Says she had very good winter, with little respiratory or allergy complaints. Just got irritated eyes last weekend after being outdoors all day. Some irritation in nose with drainage. She asks about having her allergy vaccine at [facility] because of transportation problems.  December 07, 2008- Asthma/ COPD Cough x 3 days. Feels "full in trhoat" with non[productive cough she describes as like a spell of asthma coming on. There is no fever, sore throat, phlegm, chest pain or palpitation. Not hungry, but not losing weight. Asks refill cough syrup. She brings neb meds out of date- not using. She asks for a neb here today.  Allergy vaccine is getting harder for her logistically. We discussed stopping when she runs out and observing as time goes on to see how she does without.  Jul 02, 2009- Asthma/ COPD Got through winter ok. 4-5 weeks ago she had dizzy spells and passed out twice. Wore heart monitior- ok. labs ok. Then left eye matted, puffy. Eye doctor told her allergy, but he also recommended laser eye surgery. Better, but still feels "touchy" left face. Blowing nose white. Ears ok. Lexapro  made her drowsy. Coughing spells. Wakes strangled- uses extra pillow. Tires easily walking across room x 3-4 weeks. The falling hurt right leg- seeing Dr Despina Hick. Heavy legs- can't tell if more than usual.  Wheezed last night. Denies reflux. Not driving.    Preventive Screening-Counseling & Management  Alcohol-Tobacco     Smoking Status: never  Current Medications  (verified): 1)  Xopenex Home Neb 1.25 Mg Use Tid Prn .... Three Times A Day As Needed 2)  Ventolin Hfa 108 (90 Base) Mcg/act  Aers (Albuterol Sulfate) .... As Needed 3)  Prilosec 10 Mg  Cpdr (Omeprazole) .... Once Daily 4)  Norvasc 10 Mg  Tabs (Amlodipine Besylate) .... Once Daily 5)  Zetia 10 Mg  Tabs (Ezetimibe) .... Once Daily 6)  Diovan 320 Mg  Tabs (Valsartan) .... Once Daily 7)  Metformin Hcl 1000 Mg Tabs (Metformin Hcl) .... Take 1 By Mouth Two Times A Day 8)  Epipen 0.3 Mg/0.71ml Devi (Epinephrine) .... Take As Needed For Allergy Reaction 9)  Promethazine Vc/codeine 6.25-5-10 Mg/60ml Syrp (Phenyleph-Promethazine-Cod) .Marland Kitchen.. 1 Teaspoon Four Times A Day As Needed Cough 10)  Preservision/lutein  Caps (Multiple Vitamins-Minerals) .... Take 1 By Mouth Once Daily  Allergies (verified): 1)  ! Pcn  Past History:  Past Medical History: Last updated: 06/13/2007 DM (ICD-250.00) HEART MURMUR, HX OF (ICD-V12.50) GASTROESOPHAGEAL REFLUX DISEASE (ICD-530.81) HYPERTENSION (ICD-401.9) ASTHMATIC BRONCHITIS, ACUTE (ICD-466.0) BRONCHITIS (ICD-490)  Past Surgical History: Last updated: 07-01-2008 D&C Right knee replacement  Family History: Last updated: 07-01-2008 Father- died cancer esophagus Mother- died old age  Social History: Last updated: 07/02/2009 husband had cancer of the esophagus.-died 07/12/2007 Patient never smoked.  Widowed  Risk Factors: Smoking Status: never (07/02/2009)  Social History: husband had cancer of the esophagus.-died 12-Jul-2007 Patient never smoked.  Widowed  Review of Systems      See HPI       The patient complains of shortness of breath with activity and non-productive cough.  The patient denies shortness of  breath at rest, productive cough, coughing up blood, chest pain, irregular heartbeats, acid heartburn, indigestion, loss of appetite, weight change, abdominal pain, difficulty swallowing, sore throat, tooth/dental problems, headaches, nasal  congestion/difficulty breathing through nose, and sneezing.    Vital Signs:  Patient profile:   77 year old female Height:      63 inches Weight:      178 pounds BMI:     31.65 O2 Sat:      97 % on Room air Pulse rate:   48 / minute BP sitting:   134 / 60  (right arm) Cuff size:   regular  Vitals Entered By: Reynaldo Minium CMA (Jul 02, 2009 2:58 PM)  O2 Flow:  Room air  Physical Exam  Additional Exam:  General: A/Ox3; pleasant and cooperative, NAD, obese, subdued affect SKIN: no rash, lesions NODES: no lymphadenopathy HEENT: [place]/AT, EOM- WNL, Conjuctivae- clear, PERRLA, TM-WNL, Nose- clear, Throat- clear and wnl, Mallampati  II, dentures NECK: Supple w/ fair ROM, JVD- none, normal carotid impulses w/o bruits Thyroid- CHEST: Persistent dry cough w/out wheeze HEART: RRR, 2/6 systolic murmur AS- radiating into neck ABDOMEN: obese ZOX:WRUE, nl pulses, no edema , heavy legs. Neg Homan's, but it made right lateral upper leg a little sore.  NEURO: Grossly intact to observation      Impression & Recommendations:  Problem # 1:  BRONCHITIS (ICD-490)  Chronic bronchitis. Worse cough per HPI. Watching for reflux. We will give neb and depo today for the weekend, update CXR and give sample rescue inhaler since she is out. Doubt PE but will check D-dimer. Her updated medication list for this problem includes:    Ventolin Hfa 108 (90 Base) Mcg/act Aers (Albuterol sulfate) .Marland Kitchen... As needed    Promethazine Vc/codeine 6.25-5-10 Mg/24ml Syrp (Phenyleph-promethazine-cod) .Marland Kitchen... 1 teaspoon four times a day as needed cough  Problem # 2:  HEART MURMUR, HX OF (ICD-V12.50) Assessment: Unchanged I don't think this impacts her pulmonary function, but we discussed possibility.  Problem # 3:  GASTROESOPHAGEAL REFLUX DISEASE (ICD-530.81) Watch possibility this may contibute to cough. Her updated medication list for this problem includes:    Prilosec 10 Mg Cpdr (Omeprazole) ..... Once  daily  Medications Added to Medication List This Visit: 1)  Preservision/lutein Caps (Multiple vitamins-minerals) .... Take 1 by mouth once daily  Other Orders: Est. Patient Level IV (45409) T-2 View CXR (71020TC) T-D-Dimer Fibrin Derivatives Quantitive [phone]) Admin of Therapeutic Inj  intramuscular or subcutaneous (56213) Depo- Medrol 80mg  (J1040) Nebulizer Tx (08657)  Patient Instructions: 1)  Please schedule a follow-up appointment in 1 month. 2)  Neb xop 0.63 3)  Depo 80 4)  sample rescue bronchdilator - 2 puffs four times a day  5)  A chest x-ray has been recommended.  Your imaging study may require preauthorization.  6)  lab     Medication Administration  Injection # 1:    Medication: Depo- Medrol 80mg     Diagnosis: BRONCHITIS (ICD-490)    Route: SQ    Site: RUOQ gluteus    Exp Date: 12/2011    Lot #: [id number]    Mfr: Pharmacia    Patient tolerated injection without complications    Given by: Reynaldo Minium CMA (Jul 02, 2009 4:36 PM)  Medication # 1:    Medication: Xopenex 0.63mg     Diagnosis: BRONCHITIS (ICD-490)    Dose: 1 vial     Route: inhaled    Exp Date: 09/2009    Lot #: [id number]    Mfr:  Sepracor     Patient tolerated medication without complications    Given by: Reynaldo Minium CMA (Jul 02, 2009 4:37 PM)  Orders Added: 1)  Est. Patient Level IV [16109] 2)  T-2 View CXR [71020TC] 3)  T-D-Dimer Fibrin Derivatives Quantitive [60454-09811] 4)  Admin of Therapeutic Inj  intramuscular or subcutaneous [96372] 5)  Depo- Medrol 80mg  [J1040] 6)  Nebulizer Tx [91478]

## 2010-03-08 NOTE — Miscellaneous (Signed)
Summary: Injection Record/[facility]  Injection Record/[facility]   Imported By: Sherian Rein 06/29/2009 12:17:00  _____________________________________________________________________  External Attachment:    Type:   Image     Comment:   External Document

## 2010-03-08 NOTE — Progress Notes (Signed)
Summary: pt has questions  Phone Note Call from Patient Call back at Home Phone [phone]   Caller: Patient Reason for Call: Talk to Nurse, Talk to Doctor Summary of Call: pt has questions about things Dr. Juanda Chance told her to do since she just had surgery  Initial call taken by: Omer Jack,  October 27, 2009 9:38 AM  Follow-up for Phone Call        I called and spoke with the pt. She wanted to know should she stay on her metoprolol s/p surgery. I explained she should do so. She is agreeable.  Follow-up by: Sherri Rad, RN, BSN,  October 27, 2009 4:31 PM

## 2010-03-08 NOTE — Progress Notes (Signed)
Summary: pt wants to talk w/nurse re appt  Phone Note Call from Patient   Caller: Patient Reason for Call: Talk to Nurse Summary of Call: pt rs appt -next available was 8-31 she doesn't think she should wait that long and wants to talk to a nurse-pls call [phone] Initial call taken by: Glynda Jaeger,  August 17, 2009 11:48 AM  Follow-up for Phone Call        pt not having problems but would like to be seen sooner than next available.  No appts are available with Dr Antoine Poche but an appointment was offered with the PA, which she refused.  Pt will keep appointment as scheduled. We will call her if anything becomes availble. Follow-up by: Charolotte Capuchin, RN,  August 17, 2009 12:20 PM

## 2010-03-08 NOTE — Letter (Signed)
Summary: CMN for Nebulizer/[facility]  CMN for Nebulizer/[facility]   Imported By: Sherian Rein 11/15/2009 15:10:36  _____________________________________________________________________  External Attachment:    Type:   Image     Comment:   External Document

## 2010-03-08 NOTE — Assessment & Plan Note (Signed)
Summary: eph/jml   Visit Type:  Follow-up Referring Provider:  Dr. Robley Fries Primary Provider:  Dr. Pete Glatter  CC:  Bradycardia/Pacemaker.  History of Present Illness: The patient presents for evaluation after treatment 2:1 heart block. She is status post pacemaker placement. She also had diastolic heart failure.  Since discharge she has been in rehabilitation. She says she is feeling much better. She is not having any of the previous dyspnea. She denies any palpitations, presyncope or syncope. She's not having any new PND or orthopnea though she has chronically slept on 2 pillows. She denies any chest pressure, neck or arm discomfort. Her pacemaker was interrogated today and seems to be functioning normally with some mode switch is related apparently to premature atrial contractions.  Current Medications (verified): 1)  Ventolin Hfa 108 (90 Base) Mcg/act  Aers (Albuterol Sulfate) .... As Needed 2)  Prilosec 10 Mg  Cpdr (Omeprazole) .... Once Daily 3)  Zetia 10 Mg  Tabs (Ezetimibe) .... Once Daily 4)  Diovan 320 Mg  Tabs (Valsartan) .... Once Daily 5)  Metformin Hcl 1000 Mg Tabs (Metformin Hcl) .... Take 1 By Mouth Two Times A Day 6)  Albuterol Sulfate (2.5 Mg/78ml) 0.083% Nebu (Albuterol Sulfate) .Marland Kitchen.. 1 Neb Four Times A Day As Needed 7)  Hydralazine Hcl 25 Mg Tabs (Hydralazine Hcl) .... Take 1 Tablet By Mouth Three Times A Day 8)  Acetaminophen 325 Mg  Tabs (Acetaminophen) .... As Needed 9)  Furosemide 20 Mg Tabs (Furosemide) .Marland Kitchen.. 1 By Mouth Daily 10)  Prednisolone Acetate 1 % Susp (Prednisolone Acetate) .... As Directed 11)  Lutein 40 Mg Caps (Lutein) .Marland Kitchen.. 1 Poo Daily  Allergies (verified): 1)  ! Pcn  Past History:  Past Medical History: DM (ICD-250.00) HEART MURMUR, HX OF (ICD-V12.50) GASTROESOPHAGEAL REFLUX DISEASE (ICD-530.81) HYPERTENSION (ICD-401.9) ASTHMATIC BRONCHITIS, ACUTE (ICD-466.0) BRONCHITIS (ICD-490) Heart failure with a preserved ejection fraction Bradycardia  status post pacemaker placement  Review of Systems       As stated in the HPI and negative for all other systems.   Vital Signs:  Patient profile:   77 year old female Height:      63 inches Weight:      164 pounds BMI:     29.16 Pulse rate:   96 / minute Resp:     18 per minute BP sitting:   148 / 78  (right arm)  Vitals Entered By: Marrion Coy, CNA (August 23, 2009 11:02 AM)  Physical Exam  General:  Well developed, well nourished, in no acute distress. Head:  normocephalic and atraumatic Eyes:  PERRLA/EOM intact; conjunctiva and lids normal. Mouth:   Oral mucosa normal. Neck:  Neck supple, no JVD. No masses, thyromegaly or abnormal cervical nodes. Chest Wall:  Well-healed pacemaker pocket Lungs:  Clear bilaterally to auscultation and percussion. Abdomen:  Bowel sounds positive; abdomen soft and non-tender without masses, organomegaly, or hernias noted. No hepatosplenomegaly. Msk:  Back normal, normal gait. Muscle strength and tone normal. Extremities:  No clubbing or cyanosis. Neurologic:  Alert and oriented x 3. Skin:  Intact without lesions or rashes. Cervical Nodes:  no significant adenopathy Axillary Nodes:  no significant adenopathy Inguinal Nodes:  no significant adenopathy Psych:  Normal affect.   Detailed Cardiovascular Exam  Neck    Carotids: Carotids full and equal bilaterally without bruits.      Neck Veins: Normal, no JVD.    Heart    Inspection: no deformities or lifts noted.      Palpation: normal PMI  with no thrills palpable.      Auscultation: regular rate and rhythm, S1, S2 without murmurs, rubs, gallops, or clicks.    Vascular    Abdominal Aorta: no palpable masses, pulsations, or audible bruits.      Femoral Pulses: normal femoral pulses bilaterally.      Pedal Pulses: normal pedal pulses bilaterally.      Radial Pulses: normal radial pulses bilaterally.      Peripheral Circulation: no clubbing, cyanosis, or edema noted with normal capillary  refill.     EKG  Procedure date:  08/23/2009  Findings:      Sinus rhythm, rate 96, first degree AV block, ventricular pacing  PPM Specifications Following MD:  Everardo Beals. Juanda Chance, MD     PPM Vendor:  [employer] Jude     PPM Model Number:  [id number]     PPM Serial Number:  [id number] DOI:  07/20/2009     PPM Implanting MD:  Everardo Beals. Juanda Chance, MD  Lead 1    Location: [facility]     DOI: 07/20/2009     Model #: [id number]     Serial #: [id number]     Status: active Lead 2    Location: [facility]     DOI: 07/20/2009     Model #: [id number]     Serial #: [id number]     Status: active  Magnet Response Rate:  BOL 100 ERI 85  Indications:  Syncope   Parameters Mode:  DDD     Lower Rate Limit:  60     Upper Rate Limit:  110 Paced AV Delay:  180     Sensed AV Delay:  160  Impression & Recommendations:  Problem # 1:  ACUTE DIASTOLIC HEART FAILURE (ICD-428.31) The patient is doing well. She is having no further shortness of breath. She understands risk reduction. No further testing is suggested or change in therapy.  Problem # 2:  PACEMAKER, PERMANENT (ICD-V45.01) She is up-to-date with pacemaker followup. Of note I reviewed her rhythm at length watching her on telemetry and she had variability demonstrating this to be sinus rhythm.  Problem # 3:  Other I reviewed her discharge summary. She did have some apparent renal insufficiency and hyperkalemia. However, she says labs were drawn recently by Dr. Kevan Ny and she was told they were normal. Therefore, I would not repeat these.  Other Orders: EKG w/ Interpretation (93000)  Patient Instructions: 1)  Your physician recommends that you schedule a follow-up appointment in 6 months 2)  Your physician recommends that you continue on your current medications as directed. Please refer to the Current Medication list given to you today.

## 2010-03-08 NOTE — Progress Notes (Signed)
  Phone Note From Other Clinic   Caller: Sherry/[facility] Initial call taken by: Km    Faxed Doppler over [phone] [facility]  October 19, 2009 12:29 PM

## 2010-03-08 NOTE — Progress Notes (Signed)
Summary: Dr Pete Glatter and patient  Phone Note Outgoing Call   Summary of Call: I called Dr Pete Glatter this AM to discuss lab results and my conversation with her last night. We agrred it would bew best to direct her to the ER for admission. I called her home and phone was answered by her friend from [place], saying she hadn't felt well this AM and friends had taken her to [facility]. Her friend also said he thinks she forgets her meds. Initial call taken by: Waymon Budge MD,  July 16, 2009 9:18 AM

## 2010-03-10 NOTE — Assessment & Plan Note (Signed)
Summary: f16m/ gd   Visit Type:  Follow-up Primary Provider:  Irving Shows, MD  CC:  HTN and Tachybrady.  History of Present Illness: The patient presents for followup of the above. Since I last saw her she has done relatively well. She is getting ready to move to a retirement community closed by. She does her daily chores and ambulates without any acute complaints. She's not having any chest pressure, neck or arm discomfort. She is not describing new palpitations, presyncope or syncope. There is no reported PND or orthopnea. She has had no weight gain or edema.  Current Medications (verified): 1)  Prilosec 20 Mg Cpdr (Omeprazole) .... Take One Capsule Once Daily 2)  Zetia 10 Mg  Tabs (Ezetimibe) .... Once Daily 3)  Diovan 320 Mg  Tabs (Valsartan) .... Once Daily 4)  Metformin Hcl 1000 Mg Tabs (Metformin Hcl) .... Take 1 By Mouth Two Times A Day 5)  Acetaminophen 325 Mg  Tabs (Acetaminophen) .... As Needed 6)  Furosemide 20 Mg Tabs (Furosemide) .Marland Kitchen.. 1 By Mouth Daily 7)  Brimonidine Tartrate 0.2 % Soln (Brimonidine Tartrate) .... Uad 8)  Vitamin B-12 1000 Mcg/60ml Liqd (Cyanocobalamin) .... Injection 9)  Metoprolol Succinate 25 Mg Xr24h-Tab (Metoprolol Succinate) .... Take One Tablet By Mouth Daily  Allergies (verified): 1)  ! Pcn  Past History:  Past Medical History: DM (ICD-250.00) HEART MURMUR, HX OF (ICD-V12.50) GASTROESOPHAGEAL REFLUX DISEASE (ICD-530.81) HYPERTENSION (ICD-401.9) ASTHMATIC BRONCHITIS, ACUTE (ICD-466.0) BRONCHITIS (ICD-490) Heart failure with a preserved ejection fraction Bradycardia status post pacemaker placement Left eye blindness Moderately severe aortic stenosis  Past Surgical History: D&C Right knee replacement Cataract  Review of Systems       As stated in the HPI and negative for all other systems.   Vital Signs:  Patient profile:   77 year old female Height:      63 inches Weight:      162 pounds BMI:     28.80 Pulse rate:   86 /  minute Resp:     16 per minute BP sitting:   164 / 86  (right arm)  Vitals Entered By: Marrion Coy, CNA (February 08, 2010 9:53 AM)  Physical Exam  General:  Well developed, well nourished, in no acute distress. Head:  normocephalic and atraumatic Mouth:   Oral mucosa normal. Neck:  Neck supple, no JVD. No masses, thyromegaly or abnormal cervical nodes. Chest Wall:  Well-healed pacemaker pocket Lungs:  Clear bilaterally to auscultation and percussion. Abdomen:  Bowel sounds positive; abdomen soft and non-tender without masses, organomegaly, or hernias noted. No hepatosplenomegaly. Msk:  Back normal, normal gait. Muscle strength and tone normal. Extremities:  No clubbing or cyanosis. Neurologic:  Alert and oriented x 3. Skin:  skin tear left forearm Cervical Nodes:  no significant adenopathy Axillary Nodes:  no significant adenopathy Inguinal Nodes:  no significant adenopathy Psych:  Normal affect.   Detailed Cardiovascular Exam  Neck    Carotids: Carotids full and equal bilaterally without bruits.      Neck Veins: Normal, no JVD.    Heart    Inspection: no deformities or lifts noted.      Palpation: normal PMI with no thrills palpable.      Auscultation: regular rate and rhythm, S1, S2 3/6 systolic murmur radiating out aortic outflow tract  Vascular    Abdominal Aorta: no palpable masses, pulsations, or audible bruits.      Femoral Pulses: normal femoral pulses bilaterally.      Pedal Pulses:  normal pedal pulses bilaterally.      Radial Pulses: normal radial pulses bilaterally.      Peripheral Circulation: no clubbing, cyanosis, or edema noted with normal capillary refill.     EKG  Procedure date:  02/08/2010  Findings:      Sinus rhythm, first-degree block, ventricular paced rhythm  PPM Specifications Following MD:  Everardo Beals. Juanda Chance, MD     PPM Vendor:  [employer]     PPM Model Number:  [id number]     PPM Serial Number:  [id number] DOI:  07/20/2009     PPM Implanting  MD:  Everardo Beals. Juanda Chance, MD  Lead 1    Location: RA     DOI: 07/20/2009     Model #: [id number]     Serial #: [id number]     Status: active Lead 2    Location: RV     DOI: 07/20/2009     Model #: [id number]     Serial #: [id number]     Status: active  Magnet Response Rate:  BOL 100 ERI 85  Indications:  Syncope   Parameters Mode:  DDD     Lower Rate Limit:  60     Upper Rate Limit:  110 Paced AV Delay:  180     Sensed AV Delay:  160  Impression & Recommendations:  Problem # 1:  ACUTE DIASTOLIC HEART FAILURE (ICD-428.31) She seems to be euvolemic. She will continue with medical adjustments as below. Orders: EKG w/ Interpretation (93000)  Problem # 2:  HYPERTENSION (ICD-401.9) Her blood pressure is not controlled. I will add amlodipine 2.5 mg daily.  Problem # 3:  AORTIC STENOSIS/ INSUFFICIENCY, NON-RHEUMATIC (ICD-424.1) We are managing this medically. No further imaging is indicated at this point.  Problem # 4:  PACEMAKER, PERMANENT (ICD-V45.01) I made sure that she talk with her pacemaker nurses to understand how we are following Orders: EKG w/ Interpretation (93000)  Patient Instructions: 1)  Your physician recommends that you schedule a follow-up appointment in: 6 months with Dr Antoine Poche 2)  Your physician has recommended you make the following change in your medication: Start Amlodipine 2.5 mg once a day Prescriptions: METOPROLOL SUCCINATE 25 MG XR24H-TAB (METOPROLOL SUCCINATE) Take one tablet by mouth daily  #30 x 11   Entered by:   Charolotte Capuchin, RN   Authorized by:   Rollene Rotunda, MD, [facility]   Signed by:   Charolotte Capuchin, RN on 02/08/2010   Method used:   Electronically to        [facility]* (retail)       [address] [place], [place]  [address]       Ph: [phone] or [phone]       Fax: [phone]   RxID:   [id number] AMLODIPINE BESYLATE 2.5 MG TABS (AMLODIPINE BESYLATE) one daily  #30  x 11   Entered by:   Charolotte Capuchin, RN   Authorized by:   Rollene Rotunda, MD, [facility]   Signed by:   Charolotte Capuchin, RN on 02/08/2010   Method used:   Electronically to        [facility]* (retail)       [address] [place], [place]  [address]       Ph:  [phone] or [phone]       Fax: [phone]   RxID:   [id number]  I have reviewed and approved all prescriptions at the time of this visit. Rollene Rotunda, MD, [facility]  February 08, 2010 10:34 AM

## 2010-04-14 ENCOUNTER — Other Ambulatory Visit: Payer: Self-pay | Admitting: Dermatology

## 2010-04-25 LAB — POCT I-STAT, CHEM 8
BUN: 32 mg/dL — ABNORMAL HIGH (ref 6–23)
HCT: 34 % — ABNORMAL LOW (ref 36.0–46.0)
Sodium: 130 mEq/L — ABNORMAL LOW (ref 135–145)
TCO2: 20 mmol/L (ref 0–100)

## 2010-04-25 LAB — DIFFERENTIAL
Basophils Absolute: 0 10*3/uL (ref 0.0–0.1)
Basophils Relative: 1 % (ref 0–1)
Eosinophils Relative: 0 % (ref 0–5)
Lymphocytes Relative: 16 % (ref 12–46)
Neutro Abs: 6.3 10*3/uL (ref 1.7–7.7)

## 2010-04-25 LAB — GLUCOSE, CAPILLARY
Glucose-Capillary: 124 mg/dL — ABNORMAL HIGH (ref 70–99)
Glucose-Capillary: 128 mg/dL — ABNORMAL HIGH (ref 70–99)
Glucose-Capillary: 135 mg/dL — ABNORMAL HIGH (ref 70–99)
Glucose-Capillary: 145 mg/dL — ABNORMAL HIGH (ref 70–99)
Glucose-Capillary: 153 mg/dL — ABNORMAL HIGH (ref 70–99)
Glucose-Capillary: 166 mg/dL — ABNORMAL HIGH (ref 70–99)
Glucose-Capillary: 167 mg/dL — ABNORMAL HIGH (ref 70–99)
Glucose-Capillary: 172 mg/dL — ABNORMAL HIGH (ref 70–99)
Glucose-Capillary: 184 mg/dL — ABNORMAL HIGH (ref 70–99)
Glucose-Capillary: 189 mg/dL — ABNORMAL HIGH (ref 70–99)
Glucose-Capillary: 229 mg/dL — ABNORMAL HIGH (ref 70–99)
Glucose-Capillary: 243 mg/dL — ABNORMAL HIGH (ref 70–99)
Glucose-Capillary: 79 mg/dL (ref 70–99)

## 2010-04-25 LAB — COMPREHENSIVE METABOLIC PANEL
ALT: 41 U/L — ABNORMAL HIGH (ref 0–35)
AST: 45 U/L — ABNORMAL HIGH (ref 0–37)
Albumin: 3.6 g/dL (ref 3.5–5.2)
Alkaline Phosphatase: 103 U/L (ref 39–117)
CO2: 20 mEq/L (ref 19–32)
Chloride: 100 mEq/L (ref 96–112)
GFR calc Af Amer: 42 mL/min — ABNORMAL LOW (ref >60)
GFR calc non Af Amer: 35 mL/min — ABNORMAL LOW (ref >60)
Potassium: 4.9 mEq/L (ref 3.5–5.1)
Sodium: 130 mEq/L — ABNORMAL LOW (ref 135–145)
Total Bilirubin: 0.9 mg/dL (ref 0.3–1.2)

## 2010-04-25 LAB — CBC
HCT: 31.7 % — ABNORMAL LOW (ref 36.0–46.0)
HCT: 33.8 % — ABNORMAL LOW (ref 36.0–46.0)
HCT: 35.4 % — ABNORMAL LOW (ref 36.0–46.0)
Hemoglobin: 11.2 g/dL — ABNORMAL LOW (ref 12.0–15.0)
Hemoglobin: 11.8 g/dL — ABNORMAL LOW (ref 12.0–15.0)
MCHC: 33.1 g/dL (ref 30.0–36.0)
MCHC: 33.4 g/dL (ref 30.0–36.0)
MCV: 84.5 fL (ref 78.0–100.0)
Platelets: 301 10*3/uL (ref 150–400)
Platelets: 326 10*3/uL (ref 150–400)
RBC: 3.97 MIL/uL (ref 3.87–5.11)
RBC: 4 MIL/uL (ref 3.87–5.11)
RBC: 4.16 MIL/uL (ref 3.87–5.11)
RDW: 15.8 % — ABNORMAL HIGH (ref 11.5–15.5)
RDW: 16.1 % — ABNORMAL HIGH (ref 11.5–15.5)
RDW: 16.4 % — ABNORMAL HIGH (ref 11.5–15.5)
WBC: 8.9 10*3/uL (ref 4.0–10.5)

## 2010-04-25 LAB — LIPID PANEL
Cholesterol: 190 mg/dL (ref 0–200)
HDL: 67 mg/dL (ref >39)
LDL Cholesterol: 111 mg/dL — ABNORMAL HIGH (ref 0–99)
Total CHOL/HDL Ratio: 2.8 RATIO
Triglycerides: 61 mg/dL (ref <150)

## 2010-04-25 LAB — BASIC METABOLIC PANEL WITH GFR
BUN: 24 mg/dL — ABNORMAL HIGH (ref 6–23)
CO2: 30 meq/L (ref 19–32)
Calcium: 9.1 mg/dL (ref 8.4–10.5)
Chloride: 100 meq/L (ref 96–112)
Creatinine, Ser: 1.28 mg/dL — ABNORMAL HIGH (ref 0.4–1.2)
GFR calc non Af Amer: 40 mL/min — ABNORMAL LOW
Glucose, Bld: 125 mg/dL — ABNORMAL HIGH (ref 70–99)
Potassium: 4.8 meq/L (ref 3.5–5.1)
Sodium: 136 meq/L (ref 135–145)

## 2010-04-25 LAB — MAGNESIUM: Magnesium: 2 mg/dL (ref 1.5–2.5)

## 2010-04-25 LAB — CK TOTAL AND CKMB (NOT AT ARMC)
CK, MB: 3.5 ng/mL (ref 0.3–4.0)
Total CK: 114 U/L (ref 7–177)

## 2010-04-25 LAB — HEMOCCULT GUIAC POC 1CARD (OFFICE): Fecal Occult Bld: NEGATIVE

## 2010-04-25 LAB — FOLATE RBC: RBC Folate: 735 ng/mL — ABNORMAL HIGH (ref 180–600)

## 2010-04-25 LAB — BASIC METABOLIC PANEL
CO2: 28 mEq/L (ref 19–32)
Calcium: 8.9 mg/dL (ref 8.4–10.5)
Chloride: 103 mEq/L (ref 96–112)
Chloride: 99 mEq/L (ref 96–112)
Creatinine, Ser: 1.35 mg/dL — ABNORMAL HIGH (ref 0.4–1.2)
GFR calc Af Amer: 45 mL/min — ABNORMAL LOW (ref >60)
GFR calc Af Amer: 51 mL/min — ABNORMAL LOW (ref >60)
GFR calc non Af Amer: 37 mL/min — ABNORMAL LOW (ref >60)
GFR calc non Af Amer: 38 mL/min — ABNORMAL LOW (ref >60)
Glucose, Bld: 103 mg/dL — ABNORMAL HIGH (ref 70–99)
Potassium: 4.5 mEq/L (ref 3.5–5.1)
Potassium: 4.8 mEq/L (ref 3.5–5.1)
Sodium: 135 mEq/L (ref 135–145)

## 2010-04-25 LAB — URINE CULTURE: Colony Count: 10000

## 2010-04-25 LAB — URINE MICROSCOPIC-ADD ON

## 2010-04-25 LAB — TSH: TSH: 3.657 u[IU]/mL (ref 0.350–4.500)

## 2010-04-25 LAB — VITAMIN B12: Vitamin B-12: 1127 pg/mL — ABNORMAL HIGH (ref 211–911)

## 2010-04-25 LAB — URINALYSIS, ROUTINE W REFLEX MICROSCOPIC
Glucose, UA: NEGATIVE mg/dL
Specific Gravity, Urine: 1.007 (ref 1.005–1.030)
pH: 5 (ref 5.0–8.0)

## 2010-04-25 LAB — CARDIAC PANEL(CRET KIN+CKTOT+MB+TROPI)
Total CK: 107 U/L (ref 7–177)
Troponin I: 0.03 ng/mL (ref 0.00–0.06)

## 2010-04-25 LAB — MRSA PCR SCREENING: MRSA by PCR: NEGATIVE

## 2010-04-25 LAB — BRAIN NATRIURETIC PEPTIDE
Pro B Natriuretic peptide (BNP): 936 pg/mL — ABNORMAL HIGH (ref 0.0–100.0)
Pro B Natriuretic peptide (BNP): 982 pg/mL — ABNORMAL HIGH (ref 0.0–100.0)

## 2010-04-25 LAB — APTT: aPTT: 34 seconds (ref 24–37)

## 2010-04-25 LAB — IRON AND TIBC: Saturation Ratios: 11 % — ABNORMAL LOW (ref 20–55)

## 2010-06-24 NOTE — Assessment & Plan Note (Signed)
[facility]                            CARDIOLOGY OFFICE NOTE   LESTER, PLATAS                        MRN:          [id number]  DATE:01/25/2006                            DOB:          06-13-1923    REFERRING PHYSICIAN:  Tasia Catchings, M.D.   CHIEF COMPLAINT:  Heart murmur.   HISTORY OF PRESENT ILLNESS:  Patient self-refers for evaluation of heart  murmur which was noted by her ophthalmologist at [facility].  She had surgery at [facility] for a retinal hemorrhage in  September.  Her ophthalmologist noted a murmur and suggested the patient  seek Cardiology evaluation.  Ms. Hovatter has not seen Dr. Sherin Quarry about  this.  I care for her husband, and she scheduled an appointment with me.   Ms. Montesinos denies any chest pain, orthopnea, edema, palpitations, syncope  and presyncope.  She does have some chronic mild bipedal edema and mild  chronic exertional dyspnea, which she attributes to her weight and  relatively sedentary lifestyle.  Neither of these have changed over the  past several years.   PAST MEDICAL HISTORY:  Hypertension, diabetes mellitus,  hypercholesterolemia, surgery for a retinal hemorrhage in September 2007  and a knee replacement.   ALLERGIES:  PATIENT THINKS SHE MAY BE ALLERGIC TO PENICILLIN, BUT DOES  NOT KNOW THE REACTION.   CURRENT MEDICATIONS:  1. Prilosec.  2. Metformin 1000 mg twice per day.  3. Avandia 2 mg per day.  4. Norvasc 10 mg per day.  5. Zetia 10 mg per day.  6. Diovan 320 mg per day.   SOCIAL HISTORY:  Patient is a retired Solicitor.  She is married to [employer].  She has no siblings.  Father and mother died at a late age of  congestive heart failure in the setting of hypertension.  She denies  alcohol, tobacco, and illicit drug use.   REVIEW OF SYSTEMS:  Wears glasses and bilateral hearing aids.  Has full  dentures.  Occasional reflux symptoms.  Occasional cramping in her left  hand and  legs, particularly at night.  Review of systems is otherwise  negative in detail except as above.   PHYSICAL EXAMINATION:  She is generally well appearing, in no distress.  Heart rate of 84.  Blood pressure 146/72.  She is 5 feet 4 inches tall  and weighs 187 pounds.  HEENT:  Normal.  SKIN EXAM:  Normal.  MUSCULOSKELETAL EXAM:  Normal.  She has no jugular venous distention, thyromegaly, or lymphadenopathy.  LUNGS:  Clear to auscultation.  Respiratory effort is normal.  She has a nondisplaced point of maximal cardiac impulse.  There is a  regular rate and rhythm with a 2/6 systolic ejection murmur versus  bruit.  It is heard at the right upper sternal border, but is actually  best heard at the suprasternal notch.  There is no S3 or S4.  ABDOMEN:  Obese, soft, non-distended, non-tender.  There is no  hepatosplenomegaly.  Bowel sounds are normal.  There is no midline  pulsatile mass.  EXTREMITIES:  Warm without  clubbing, cyanosis, or ulceration.  There is  trace bipedal edema.  There is no evidence of chronic venostasis.  Carotid pulses are 2+ bilaterally with soft bilateral bruits versus a  transmitted murmur.  Femoral pulses 2+ bilaterally without bruit.   Electrocardiogram demonstrates normal sinus rhythm with poor R-wave  progression V1 to V2, probably due to lead placement.  Otherwise,  normal.   IMPRESSION/RECOMMENDATIONS:  Ms. Plourde has a murmur that I think is aortic  sclerosis.  It is heard in a slightly unusual location, in that it is  higher on her chest than usual.  This does raise question for a common  carotid stenosis.  We will, therefore, check carotid duplex as well as  an echocardiogram.  I am not scheduling her for followup, but will  contact her about the results of this study.     Salvadore Farber, MD  Electronically Signed    WED/MedQ  DD: 01/25/2006  DT: 01/25/2006  Job #: [id number]   cc:   Tasia Catchings, M.D.

## 2010-06-24 NOTE — Assessment & Plan Note (Signed)
[facility]                             PULMONARY OFFICE NOTE   Annette Lyons, Annette Lyons                        MRN:          [id number]  DATE:01/04/2006                            DOB:          1923/10/13    PROBLEM:  1. Asthmatic bronchitis.  2. Diabetes.  3. Hypertension.  4. Esophageal reflux.   HISTORY:  She was seen on November 19 by the nurse practitioner for an  acute upper respiratory infection treated with Omnicef, Mucinex and  cough syrup.  She had some trouble swallowing the Omnicef capsules and  stopped it a day early.  She admits some stress because husband has  recurrent esophageal cancer.  She is noticing a little thick phlegm, no  fever and just feels closed up in her chest as she finishes her  prednisone taper today.  Her home nebulizer helps, used b.i.d.  She  cannot tell if she is working Advair correctly because she does not feel  anything, and after discussion I suggested she might be happier with a  metered inhaler of Symbicort.   MEDICATIONS:  1. Allergy vaccine.  2. Prilosec.  3. Metformin 1000 mg b.i.d.  4. Avandia 2 mg.  5. Norvasc 10 mg.  6. Diovan 80 mg.  7. Zetia.  8. Albuterol HFA home nebulizer.  9. Xopenex 1.25 mg t.i.d. p.r.n.   ALLERGIES:  Drug intolerant possibly to PENICILLIN.   OBJECTIVE:  Weight 188 pounds.  Blood pressure 146/78, pulse regular 84,  room air saturation 98%.  Congested cough, no rale, rhonchi, or focal  dullness or rub.  Heart sounds are regular without murmur.  No  adenopathy.   IMPRESSION:  Acute exacerbation of asthma with COPD.   PLAN:  1. Depo-Medrol 80 mg IM with another discussion of steroid effects on      her diabetes.  2. Z-Pak.  3. Symbicort 160/4.5 2 puffs b.i.d. with instructions.  4. Keep track of blood sugar, adequate fluids, walk for weight loss.  5. Schedule return 3 months but earlier p.r.n.     Clinton D. Maple Hudson, MD, [facility] Bollman, FACP  Electronically Signed    CDY/MedQ  DD: 01/06/2006  DT: 01/07/2006  Job #: [id number]

## 2010-06-24 NOTE — Discharge Summary (Signed)
NAME:  Annette Lyons, Annette Lyons                           ACCOUNT NO.:  [id number]   MEDICAL RECORD NO.:  [id number]                   PATIENT TYPE:  IPS   LOCATION:  4009                                 FACILITY:  [facility]   PHYSICIAN:  Junie Bame, P.A.                 DATE OF BIRTH:  08/21/1923   DATE OF ADMISSION:  02/20/2002  DATE OF DISCHARGE:  03/06/2002                                 DISCHARGE SUMMARY   DISCHARGE DIAGNOSES:  1. Right total knee arthroplasty secondary to osteoarthritis.  2. History of diabetes mellitus.  3. History of elevated cholesterol.  4. History of hypertension.  5. Anemia.   HISTORY OF PRESENT ILLNESS:  The patient is a 77 year old white female with  a past medical history of elevated cholesterol, hypertension and diabetes  mellitus, admitted on February 17, 2002, for elective right  total knee  arthroplasty secondary to osteoarthritis, performed on February 18, 2002, by  Dr. Ollen Gross. The patient was placed on Coumadin for DVT prophylaxis.   The physical therapy report at this time indicates the patient is ambulating  to 10 feet with a rolling walker with moderate assistance, for transfer sit-  to-stand total assistance. Her hospital course was significant for anemia  and confusion. The patient was transferred to [facility] inpatient  rehabilitation department on February 20, 2002.   PAST MEDICAL HISTORY:  1. Diabetes mellitus.  2. Hypertension.  3. Elevated FFT.  4. Decreased hearing.  5. Elevated cholesterol.  6. GERD.  7. Cystitis.  8. Asthmatic bronchitis.   PAST SURGICAL HISTORY:  1. Liver biopsy.  2. Carpal tunnel repair.   ALLERGIES:  PENICILLIN.   PRIMARY CARE PHYSICIAN:  Nickolas Madrid, M.D.   REVIEW OF SYSTEMS:  Significant for joint pain and joint swelling.   FAMILY HISTORY:  Noncontributory.   SOCIAL HISTORY:  The patient is married and lives with her husband in a  multi level home in [place], [place]. Her  husband can assist at  the time of discharge. She is able to sleep on the first floor.   HOSPITAL COURSE:  The patient was admitted to [facility]  rehabilitation department on February 20, 2002, for comprehensive inpatient  rehabilitation and she received more than 3 hours of therapy. Overall she  made slow progress but eventually was made modified independent,  independently ambulating greater than 100 feet with a rolling walker.   Her hospital course was significant for elevated CBGs, constipation, and  reflux. The patient remained  on Coumadin throughout her entire stay in  rehabilitation for DVT prophylaxis. The patient had venous Dopplers  performed on February 20, 2002, which demonstrated no evidence of DVT,  superficial thrombus or Baker's cyst. Overall the patient did tolerate  therapies fairly well but was making slow progress but eventually did  gradually improve.   The patient had problems with the CPM machine, occasionally  refusing to use  the CPM machine due to discomfort. The bio tech was called to adjust the CPM  machine. The patient presently has approximately 60 degrees of flexion in  her right knee.   On February 23, 2002, Actos was increased from 15 to 30 mg due to elevated  CBGs. Once adjustment was made in this medication, CBGs improved. At the  time of admission she was started on Vicodin as needed for pain, but due to  confusion and sedation, the patient's Vicodin was discontinued and she  received Ultram instead for pain.   At the time of admission the patient was started on Trinsicon due to her  hemoglobin of 9.6. The Trinsicon was discontinued per the patient's request  and she was started on a multivitamin with iron p.o. q.d. started on February 26, 2002. The patient also received scheduled Tylenol 650 mg p.o. t.i.d. for  pain. The patient received Prilosec 20 mg p.o. q.d. for reflux and received  Sorbitol p.r.n. constipation. No other major issues  occurred while she was  in rehabilitation.   Her latest labs indicated that her latest INR was 2.8, latest hemoglobin was  9.6, hematocrit 27.6, white blood cell count 8.9, platelet count 314. Latest  sodium level was 134, potassium 4.0, glucose 178, BUN 9, creatinine 0.6. AST  was 33, ALT 29, alkaline phosphatase 98. A urine culture performed on  February 25, 2002, with 30,000 colonies  multispecies present without  uropathogens isolated.   At the time of discharge all vital signs were stable. Blood pressure was  running from 122/62, temperature 97.8, pulse 20. Her surgical incision was  well healed and Steri-Strips were applied. She had 1+ to 2+ edema and no  signs of infection. Her CBGs returned from 111, 135, 141, 112 to 119. The  physical therapy reported indicated the patient was  ambulating  approximately 200 feet modified independent with a rolling walker when in  the knee immobilizer. For transfers sit-to-stand modified independent. Bed  mobility modified independent. She performed most ADLs modified  independently. She was discharged to home with her husband.   DISCHARGE MEDICATIONS:  1. Norvasc 55 mg q.d.  2. Glucophage 100 mg 1 tablet b.i.d.  3. Diovan 320 mg 1 tablet q.d.  4. Actos 30 mg 1 tablet p.o. q.d.  5. Prilosec 20 mg p.o. q.d.  6. Os-Cal resume home dose.  7. Coumadin 2 mg q. p.m. until March 21, 2002.  8. Ultram 25 mg q.d. p.r.n. pain.  9. Ambien 5 mg p.r.n. sleep.  10.      No aspirin, ibuprofen or Aleve while on Coumadin.  11.      Tylenol and Ultram p.r.n. pain management.   DISCHARGE INSTRUCTIONS:  Activity, no driving, no drinking. Use walker. Hold  Prempro. The patient may shower. She is to use the CPM machine and knee  immobilizer when walking.   FOLLOW UP:  She is to follow up with Dr. Ollen Gross in 2 weeks, call for  an appointment. Followup with Dr. Ellwood Dense at [phone] p.r.n. Followup with Dr. Nickolas Madrid to follow up with anemia.  She will have [facility] for PT/INR. The first draw will be Monday, March 10, 2002, for a  Coumadin level.  Junie Bame, P.A.    LH/MEDQ  D:  03/06/2002  T:  03/06/2002  Job:  [id number]   cc:   Ollen Gross, M.D.  [address]  [place]  [place] [address]  Fax: [phone]   Nickolas Madrid, M.D.

## 2010-06-24 NOTE — Op Note (Signed)
   NAME:  Annette Lyons, Annette Lyons                           ACCOUNT NO.:  [id number]   MEDICAL RECORD NO.:  [id number]                   PATIENT TYPE:  AMB   LOCATION:  DAY                                  FACILITY:  [facility]   PHYSICIAN:  Ollen Gross, M.D.                 DATE OF BIRTH:  24-May-1923   DATE OF PROCEDURE:  05/26/2002  DATE OF DISCHARGE:                                 OPERATIVE REPORT   PREOPERATIVE DIAGNOSIS:  Arthrofibrosis, right total knee arthroplasty.   POSTOPERATIVE DIAGNOSIS:  Arthrofibrosis, right total knee arthroplasty.   PROCEDURE:  Closed manipulation, right knee.   SURGEON:  Gus Rankin. Aluisio, M.D.   ASSISTANT:  None.   ANESTHESIA:  General..   COMPLICATIONS:  None.   PREOPERATIVE RANGE OF MOTION:  0-70.   POSTMANIPULATION RANGE OF MOTION:  0-115.   CONDITION:  Stable to recovery.   BRIEF CLINICAL NOTE:  Ms, [name] is a 77 year old female, without underwent a  right total knee arthroplasty on 02/17/02.  She has had poor motion  postoperatively.  She has had extensive physical therapy without improvement  in motion.  She is having dysfunction because of the stiffness, and she  presents now for closed manipulation.   PROCEDURE IN DETAIL:  After the successful administration of general  anesthetic, the patient is placed supine on the operating table, and  preoperative range of motion is 0-70.  With gentle pressure with my chest  against her proximal tibia, flexed the knee and could audibly hear the  adhesions breaking.  I was able to easily get her to 115 degrees at which  point her calf was up against her thigh.  She tolerated the procedure well  without complications and transported to the recovery room in stable  condition.                                               Ollen Gross, M.D.    FA/MEDQ  D:  05/26/2002  T:  05/26/2002  Job:  [id number]

## 2010-06-24 NOTE — Op Note (Signed)
NAME:  Annette Lyons, Annette Lyons                           ACCOUNT NO.:  [id number]   MEDICAL RECORD NO.:  [id number]                   PATIENT TYPE:  INP   LOCATION:  NA                                   FACILITY:  [facility]   PHYSICIAN:  Ollen Gross, M.D.                 DATE OF BIRTH:  1923-07-20   DATE OF PROCEDURE:  02/17/2002  DATE OF DISCHARGE:                                 OPERATIVE REPORT   PREOPERATIVE DIAGNOSIS:  Osteoarthritis, right knee.   POSTOPERATIVE DIAGNOSIS:  Osteoarthritis, right knee.   PROCEDURE:  Right total knee arthroplasty.   SURGEON:  Ollen Gross, M.D.   ASSISTANT:  Alexzandrew L. Julien Girt, P.A.   ANESTHESIA:  General.   ESTIMATED BLOOD LOSS:  150.   DRAINS:  Hemovac x1.   COMPLICATIONS:  None.   TOURNIQUET TIME:  48 minutes at 300 mmHg.   CONDITION:  Stable to recovery.   CLINICAL NOTE:  The patient is a 77 year old female with severe end-stage  osteoarthritis of the right knee, with pain refractory to nonoperative  management.  She presents now for right total knee arthroplasty.   DESCRIPTION OF PROCEDURE:  After the successful administration of a general  anesthetic, the tourniquet was placed high on the right thigh and the right  lower extremity prepped and draped in the usual sterile fashion.  The  extremity was wrapped with an Esmarch, knee flexed, and the tourniquet  inflated to 300 mmHg.  A standard midline incision was made with a 10 blade  through a thick layer of subcutaneous tissue to the level of the extensor  mechanism.  A fresh blade was used to make a medial parapatellar arthrotomy  and the soft tissue over the proximal medial tibia subperiosteally elevated  to the joint line with a knife into the semimembranosus bursa with a curved  osteotome.  Soft tissue over the proximal lateral tibia is also elevated  with attention being paid to avoiding the patellar tendon on the tibial  tubercle.  The patella was then everted and the knee  flexed to 90 degrees,  the ACL and PCL removed.  A drill was used to create a starting hole in the  distal femur.  The canal was irrigated prior to the right valgus alignment  guide placed.  Referencing off the posterior condyles, rotation was marked,  blocked, pins, removed 10 mm off the distal femur.  Distal femoral resection  was made with an oscillating saw.  A sizing block is placed.  A size 3 is  most appropriate.  AP block is placed, and rotation is marked off the  posterior condyles, which corresponded with the epicondylar axis.  Anterior  and posterior cuts are subsequently made.   The tibia is subluxed forward and the menisci removed.  Extramedullary  tibial alignment guide is placed, referencing proximally at the medial  aspect of the tibial tubercle and distally  along the second metatarsal axis  and tibial crest.  Blocked and pins, removed 10 mm off the nondeficient  lateral side.  The dissection is made with an oscillating saw, a size 3  trial placed, and the proximal tibia prepared with a modular drill and keel  punch.  The intercondylar and chamfer blocks then placed on the femur and  those cuts subsequently made.  Size 3 posterior-stabilized femoral trial  with the size 3 mobile bearing tibial trial, 10 mm posterior-stabilized  rotating platform anterior trial place.  Full extension is achieved with  excellent varus and valgus balance throughout.  The patella is everted.  It  is measured to be 23 mm.  Free-hand resection taken to 13 mm.  A 38 template  placed, the lug hole was drilled, trial patella placed, and it tracks  normally.  The osteophytes are then removed off the posterior femur with the  trial in place.  All trials are then removed and cut bone surfaces prepared  with pulsatile lavage.  Cement is mixed and once ready for implantation, a  size 3 mobile bearing tibial tray, size 3 posterior-stabilized femoral  component, and 38 patella are cemented into place, and  the patella is held  with a clamp.  A trial 10 mm insert is placed and the knee held in full  extension and all extruded cement removed.  Once cement is fully hardened,  then the permanent 10 mm posterior-stabilized rotating platform insert is  placed into the tibial tray and the knee reduced, with excellent stability  throughout and full range of motion.  The wound is copiously irrigated with  antibiotic solution and the extensor mechanism closed over a Hemovac drain  with interrupted #1 PDS.  Flexion against gravity is 125 degrees.  Tourniquet is released with a total time of 48 minutes and minor bleeding  stopped with cautery.  Subcu is closed in two layers with interrupted 2-0  Vicryl and subcuticular closure with running 4-0 Monocryl.  The incision is  cleaned and dried and Steri-Strips and a bulky sterile dressing applied.  She is subsequently awakened and transported to recovery in stable  condition.                                               Ollen Gross, M.D.    FA/MEDQ  D:  02/17/2002  T:  02/17/2002  Job:  [id number]

## 2010-06-24 NOTE — Assessment & Plan Note (Signed)
[facility]                               PULMONARY OFFICE NOTE   Annette Lyons, Annette Lyons                        MRN:          [id number]  DATE:12/25/2005                            DOB:          1923-08-31    HISTORY OF PRESENT ILLNESS:  The patient is a 77 year old white female  patient of Dr. Roxy Cedar who has a known history of asthmatic bronchitis,  presents for a 1-week history of a cough, congestion, and general malaise.  Denies any fever, chest pain, orthopnea, PND, or leg swelling.   PAST MEDICAL HISTORY:  Reviewed.   CURRENT MEDICATIONS:  Reviewed.   PHYSICAL EXAMINATION:  GENERAL:  The patient is a pleasant female in no  acute distress.  VITAL SIGNS:  She is afebrile with stable vital signs.  O2 saturation is 98%  on room air.  HEENT:  Nasopharynx shows some mild erythema.  Nontender sinuses.  Posterior  pharynx clear.  NECK:  Supple without adenopathy.  No JVD.  LUNG SOUNDS:  Reveal coarse breath sounds without any wheezing.  CARDIAC:  Regular rate.  ABDOMEN:  Soft.  LOWER EXTREMITIES:  Warm without any edema.   IMPRESSION AND PLAN:  Acute upper respiratory infection.  The patient is to  begin Omnicef x7 days.  Mucinex DM twice daily.  Ental HD #8 ounces one-half  to one teaspoon every 6 hours as needed for cough.  The patient is to return  as scheduled with Dr. Maple Hudson or sooner if needed.      Annette Oaks, Annette Lyons  Electronically Signed      Clinton D. Maple Hudson, MD, Tonny Bollman, FACP  Electronically Signed   TP/MedQ  DD: 12/25/2005  DT: 12/25/2005  Job #: [id number]

## 2010-06-24 NOTE — H&P (Signed)
NAME:  Annette Lyons, Annette Lyons                           ACCOUNT NO.:  [id number]   MEDICAL RECORD NO.:  [id number]                   PATIENT TYPE:  INP   LOCATION:  NA                                   [facility]ACILITY:  [facility]   PHYSICIAN:  Gus Rankin. Aluisio, M.D.              DATE OF BIRTH:  02-27-1923   DATE OF ADMISSION:  02/17/2002  DATE OF DISCHARGE:                                HISTORY & PHYSICAL   CHIEF COMPLAINT:  Right knee pain.   HISTORY OF PRESENT ILLNESS:  The patient is a 77 year old female who has  been seen by Dr. Lequita Halt for ongoing right knee pain. She is known to have  right arthritis. She has been seen in the office where x-rays show advanced  arthritis with bone-on-bone changes in the medial compartment with slight  patellofemoral changes.   She has been treated conservatively in the past for her knee arthritis. She  has undergone injections of cortisone in the past which have only provided  limited relief. The patient states her knee is getting worse and is starting  to give out. The injections have helped but have provided limited relief.  Due to the progressive nature and the significant findings on x-ray, it was  felt she would benefit from undergoing a knee replacement. The risks and  benefits of this procedure have been discussed with the patient and she has  elected to proceed with surgery.   ALLERGIES:  PENICILLIN.   CURRENT MEDICATIONS:  1. Norvasc 5 mg q.d.  2. Actos 15 mg q. a.m.  3. Prempro q.d.  4. Prilosec 20 mg q.d.  5. Zocor 50 mg q.d.  6. Glucophage 1000 mg p.o. q. a.m. and q. p.m.  7. Diovan 320 mg q.d.  8. Os-Cal q.d.   PAST MEDICAL HISTORY:  1. Diabetes mellitus.  2. Hypertension.  3. Hypercholesteremia.  4. History of elevated liver enzymes.  5. Asthmatic bronchitis.  6. Postmenopausal.  7. Cystitis.  8. Urinary incontinence.  9. Gastroesophageal reflux disease.  10.      History of  shingles.  11.      Diminished hearing.  12.       Anxiety.  13.      Eczema.  14.      Macular degeneration.    PAST SURGICAL HISTORY:  1. Right breast biopsy x2.  2. Dilatation and curettage.  3. Liver biopsy.  4. Carpal tunnel repair on the right.   SOCIAL HISTORY:  [place] native. Married since 1946.   FAMILY HISTORY:  Father deceased age 47 with history of esophageal cancer.  Mother deceased age 34 with heart disease, peripheral vascular disease and  degenerative joint disease. She has one son who died in 54 of AIDS.   REVIEW OF SYSTEMS:  GENERAL:  No fever or chills or night sweats.  NEUROLOGIC:  She has had some difficulty hearing and uses bilateral hearing  aids. She is not wearing at the time of the visit. No seizures or syncope.  RESPIRATORY:  She has had some intermittent slight cough and feels a  fullness in her throat, but she has no pain or difficulty swallowing. No  hemoptysis or shortness of breath. CARDIOVASCULAR:  No chest pain, angina or  orthopnea. GI: She does have a history of GERD. No nausea or vomiting or  diarrhea or constipation. She also  has a history of elevated liver enzymes.  GU:  No dysuria, hematuria or discharge. MUSCULOSKELETAL:  Pertinent to the  right knee found in the history of present illness.   PHYSICAL EXAMINATION:  VITAL SIGNS:  Pulse 94, respirations 16, blood  pressure 200/90.  GENERAL:  The patient is a 77 year old female well developed, well  nourished. She is noted to wear glasses. Overweight. In no acute distress.  She is alert and oriented and cooperative.  HEENT:  Normocephalic, atraumatic. Pupils equally round and reactive. She is  noted to wear glasses. Oropharynx is clear.  NECK:  Supple. There are trace bruits noted both on the left and right  carotid versus referred murmur from the thoracic cavity. She does have upper  and lower dentures noted.  CHEST:  She has some slight crackles at the left base which are decreased  and cleared after coughing. Otherwise the chest  walls are clear anterior and  posterior chest.  HEART:  She has a grade 3/6 systolic ejection murmur noted and heard at  aortic, pulmonic and Erb's point.  ABDOMEN: Round, soft, nontender, bowel sounds are present.  RECTAL, BREASTS, GENITALIA:  Not done, not pertinent to present illness.  EXTREMITIES:  Pertinent to the right lower extremity. The right knee has a  range of motion of 0 to 150 degrees. There is no instability. She is tender  about the jointlines. No signs of infection, no effusion.   IMPRESSION:  1. Osteoarthritis, right knee.  2. Diabetes mellitus.  3. Hypertension.  4. Hypercholesteremia.  5. History of elevated liver enzymes.  6. Chronic asthmatic bronchitis.  7. Postmenopausal.  8. History of cystitis.  9. Gastroesophageal reflux disease.  10.      History of shingles.  11.      Diminished hearing.  12.      Anxiety.  13.      Eczema.  14.      Macular degeneration.  15.      Stress urinary incontinence with coughing.   PLAN:  The patient will be admitted to [facility] to undergo a  right total knee replacement arthroplasty. The surgery will be performed by  Dr. Ollen Gross. The patient's medical doctor is Dr. Sherin Quarry. Dr.  Sherin Quarry will be notified with a room number on admission and will be  consulted on the patient if needed throughout the hospital course.     Alexzandrew L. Julien Girt, P.A.              Gus Rankin Aluisio, M.D.    ALP/MEDQ  D:  02/12/2002  T:  02/12/2002  Job:  [id number]   cc:   Tasia Catchings, M.D.  [address]  [place]  [place] [address]  Fax: [phone]

## 2010-06-24 NOTE — Discharge Summary (Signed)
NAME:  Annette Lyons, Annette Lyons                           ACCOUNT NO.:  [id number]   MEDICAL RECORD NO.:  [id number]                   PATIENT TYPE:  INP   LOCATION:  0481                                 FACILITY:  [facility]   PHYSICIAN:  Ollen Gross, M.D.                 DATE OF BIRTH:  12/29/1923   DATE OF ADMISSION:  02/17/2002  DATE OF DISCHARGE:  02/20/2002                                 DISCHARGE SUMMARY   ADMISSION DIAGNOSES:  1. Osteoarthritis of the right knee.  2. Diabetes mellitus.  3. Hypertension.  4. Hypercholesterolemia.  5. History of elevated liver enzymes.  6. Chronic asthmatic bronchitis.  7. Postmenopausal.  8. History of cystitis.  9. Gastroesophageal reflux disease.  10.      History of shingles.  11.      Diminished hearing.  12.      Anxiety.  13.      Eczema.  14.      Macular degeneration.  15.      Stress urinary incontinence.   DISCHARGE DIAGNOSES:  1. Osteoarthritis of the right knee status post right total knee replacement     with arthroplasty.  2. Mild postoperative blood loss anemia.  3. Remaining discharge diagnoses the same as admitting diagnoses.   PROCEDURE:  The patient was taken to the OR on 02/17/2002 and underwent a  right total knee replacement with arthroplasty.  The surgeon was Dr. Ollen Gross, assistant Alexzandrew L. Perkins, P.A.C.  General anesthesia.  Estimated blood loss 150 mL.  Hemovac drain x1.  Tourniquet time 48 minutes.  _________ mmHg.   CONSULTATIONS:  Rehabilitation services, Dr. Ranelle Oyster, M.D.   HISTORY OF PRESENT ILLNESS:  The patient is a 77 year old female seen by Dr.  Lequita Halt for ongoing right knee pain.  She has known arthritis.  X-rays in  the office show bone on bone changes and medial compartment with slight  patellofemoral changes.  It was felt that she had reached the point where  could benefit from undergoing total knee.  The risks and benefits were  discussed, and the patient was subsequently  admitted for surgery.   LABORATORY DATA:  CBC on admission revealed a hemoglobin of 13.7, hematocrit  41.2, white cell count 2.0, red cell count 4.60.  Serial H&H were followed.  Postoperative H&H 10.4 and 29.9.  Last noted H&H 9.8 and 27.9.  Admission  CBC, its differential within normal limits.  PT and PTT on admission 13.7  and 38, respectively, with an INR of 1.0.  She had protimes followed per  Coumadin protocol.  Last noted PT/INR 24.4 and 2.6.  Chemistry panel on  admission showed minimally elevated glucose of 124.  Remaining chemistry  panel all within normal limits.  Serial BMPs were followed.  Glucose did  increase up to 159 but back down to 152.  Calcium dropped from 9.5 to 8.2  back up to 8.6.  She had a slight drop in sodium from 137 to 135, last noted  at 132.  Urinalysis on admission was negative.  Blood type 0 positive.   EKG dated 02/07/2002 revealed normal sinus rhythm, minimal voltage criteria  for left ventricular hypertrophy, maybe normal variant; borderline EKG  confirmed by Dr. Veneda Melter.  Chest x-ray dated 02/07/2002 revealed minimal  left base atelectasis but no other evidence of acute cardiopulmonary  process.   HOSPITAL COURSE:  The patient was admitted to [facility] and taken  to the operating room and underwent the above surgical procedure without  complication.  The patient tolerated the procedure well and was later  discharged to the recovery room and then to the orthopedic floor for  continued postoperative care.  The patient was placed on PCA analgesia for  pain control following surgery.  She underwent a rehabilitation consult by  Dr. Riley Kill.  It was felt that she would benefit from undergoing inpatient  rehabilitation.  The patient was transferred over at that time.  The patient  did receive PT and TO while on the med-surg floor for continued gait  training ambulation.  She was slow to progress with physical therapy  initially, and was  ambulating approximately 10 feet by postoperative day #2,  requiring moderate assist.  Hemovac drain was pulled on postoperative day  #1.  PCA and narcotics were discontinued by postoperative day #2 due to mild  confusion which was felt to be narcotic-induced.  H&H were followed and may  be found in the laboratory data section of this chart.  Dressing change  initiated on postoperative day #2.  The incision was healing well.  It was  felt that the patient would be appropriate for inpatient rehabilitation.  Her confusion did resolve by the afternoon of postoperative day #2.  On  postoperative day #3, it was noted that the patient was having some  sundowning at night; however, she was reorienting in the morning.  We had  reduced her narcotic or discontinued her heavy narcotics.  She was slowly  progressing with physical therapy and felt to be an excellent candidate for  rehabilitation.  It was noted that a bed became available later that day.  The patient was in agreement.  Dr. Mindi Junker Aluisio felt she was appropriate and  was discharged over to rehabilitation.   DISCHARGE PLANS:  The patient was discharged over to [facility] on 02/20/2002.   DISCHARGE DIAGNOSES:  Please see above.   DISCHARGE MEDICATIONS:  1. Vicodin one to two every four to six hours as needed for pain.  2. Robaxin 500 mg p.o. q.6h. p.r.n. spasm.  3. She will continue her home medications as per the [facility] which will be sent     over.   ACTIVITY:  Weightbearing as tolerated to the right lower extremity.  Continue with gait training, ambulation, with PT/OT and also ADLs while on  the rehabilitation unit per total knee protocol.   FOLLOW UP:  The patient is to follow up with Dr. Lequita Halt in the office in  two weeks from surgery.  Contact the office for appointment and time or  following the discharge from the rehabilitation unit, whichever is longer.   DIET:  Low sodium, low cholesterol diabetic  diet.   DISPOSITION:  [facility].   CONDITION ON DISCHARGE:  Improving.      Alexzandrew L. Perkins, P.A.  Ollen Gross, M.D.    ALP/MEDQ  D:  03/26/2002  T:  03/26/2002  Job:  [id number]   cc:   Ollen Gross, M.D.  [address]  [place]  [place] [address]  Fax: [phone]   Tasia Catchings, M.D.  [address]  [place]  [place] [address]  Fax: [phone]

## 2010-06-24 NOTE — Assessment & Plan Note (Signed)
[facility]                             PULMONARY OFFICE NOTE   Annette Lyons, Annette Lyons                        MRN:          [id number]  DATE:04/04/2006                            DOB:          04-02-1923    PROBLEM:  1. Asthmatic bronchitis.  2. Diabetes.  3. Hypertension.  4. Esophageal reflux.  5. Heart murmur.   HISTORY:  I had seen Mrs. Haak last in November. She continues to be  stressed by the declining status of her husband who has esophageal  cancer. She had been seen for evaluation of a heart murmur by Dr. Samule Ohm  in Cardiology with echocardiogram showing mild aortic stenosis. She says  she has been a little more congested this week. She woke coughing last  night. Does not think that she has had a cold. Not much nasal  congestion. No fever. Nothing purulent.   MEDICATIONS:  1. Allergy vaccine.  2. Prilosec.  3. Metformin 1000 mg b.i.d.  4. Avandia 2 mg.  5. Norvasc 10 mg.  6. Zetia 10 mg.  7. Diovan 320 mg.  8. Albuterol rescue inhaler.  9. Home nebulizer with Xopenex 1.25 mg use t.i.d. p.r.n.   DRUG INTOLERANCES:  QUESTIONABLE ALLERGY TO PENICILLIN.   OBJECTIVE:  Weight is 193 pounds.  Blood pressure is 128/84.  Pulse  regular at 85.  Room air saturation is 98%.  Minimal nasal and chest congestion. Unlabored and without cough. There  is no post-nasal drainage, neck vein distention or stridor.  HEART: Sounds are regular. Knowing to listen for it, I can find a trace  systolic murmur in the right upper parasternal area. There is no  peripheral edema.   IMPRESSION:  Mild acute bronchitis, nonspecific, as an exacerbation of  her underlying asthma/chronic obstructive pulmonary disease.   PLAN:  We discussed options and she appreciated my suggestion that we  treat this as a mild cold with supportive care only and see if it would  clear with the medication that she already has  available. I will see her back sooner if I can be helpful,  but will  schedule a return in three months, earlier p.r.n.     Clinton D. Maple Hudson, MD, Tonny Bollman, FACP  Electronically Signed    CDY/MedQ  DD: 04/04/2006  DT: 04/04/2006  Job #: [id number]   cc:   Tasia Catchings, M.D.  Salvadore Farber, MD

## 2010-08-07 IMAGING — CR DG CHEST 2V
2 series · 2 of 2 positions shown · non-contrast
Comparison: 07/16/2009

CLINICAL DATA: Heart block status post pacemaker placement

CHEST - 2 VIEW

[w chest pa]
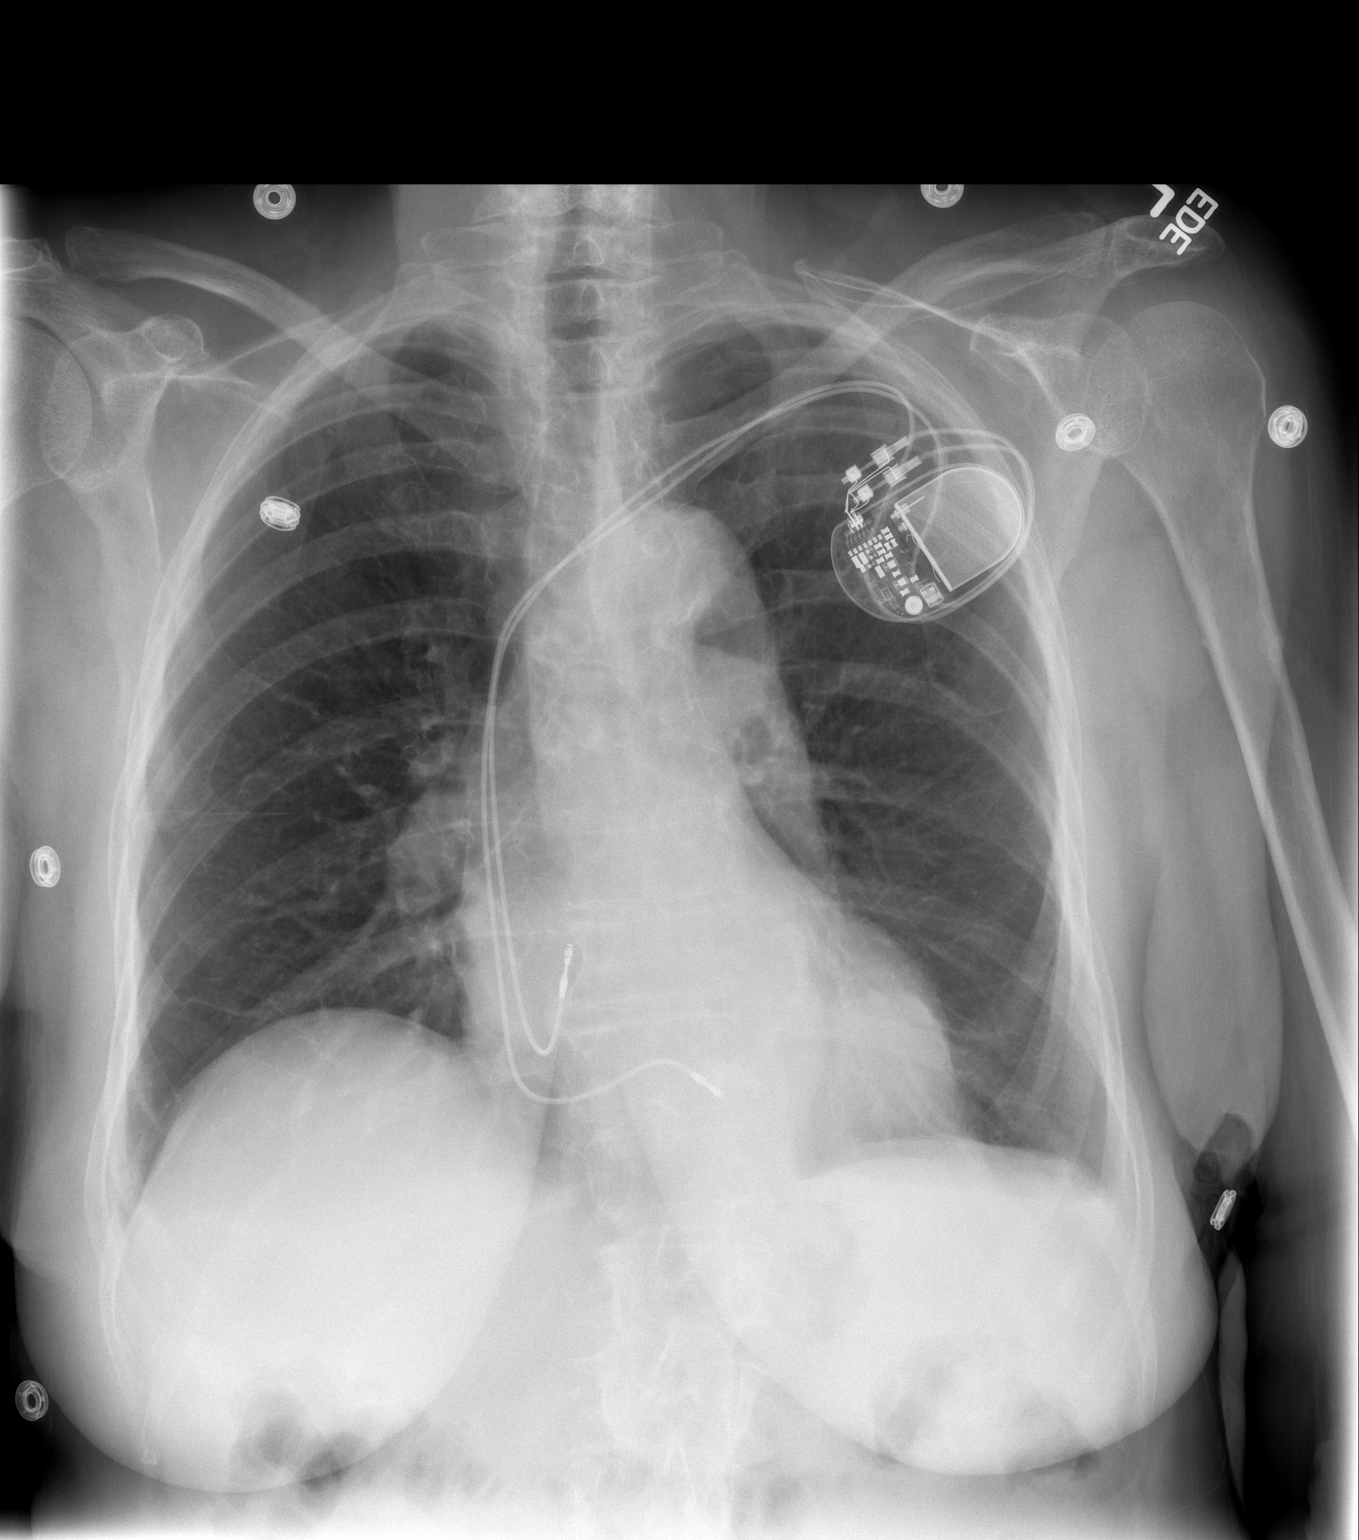

[w chest lat]
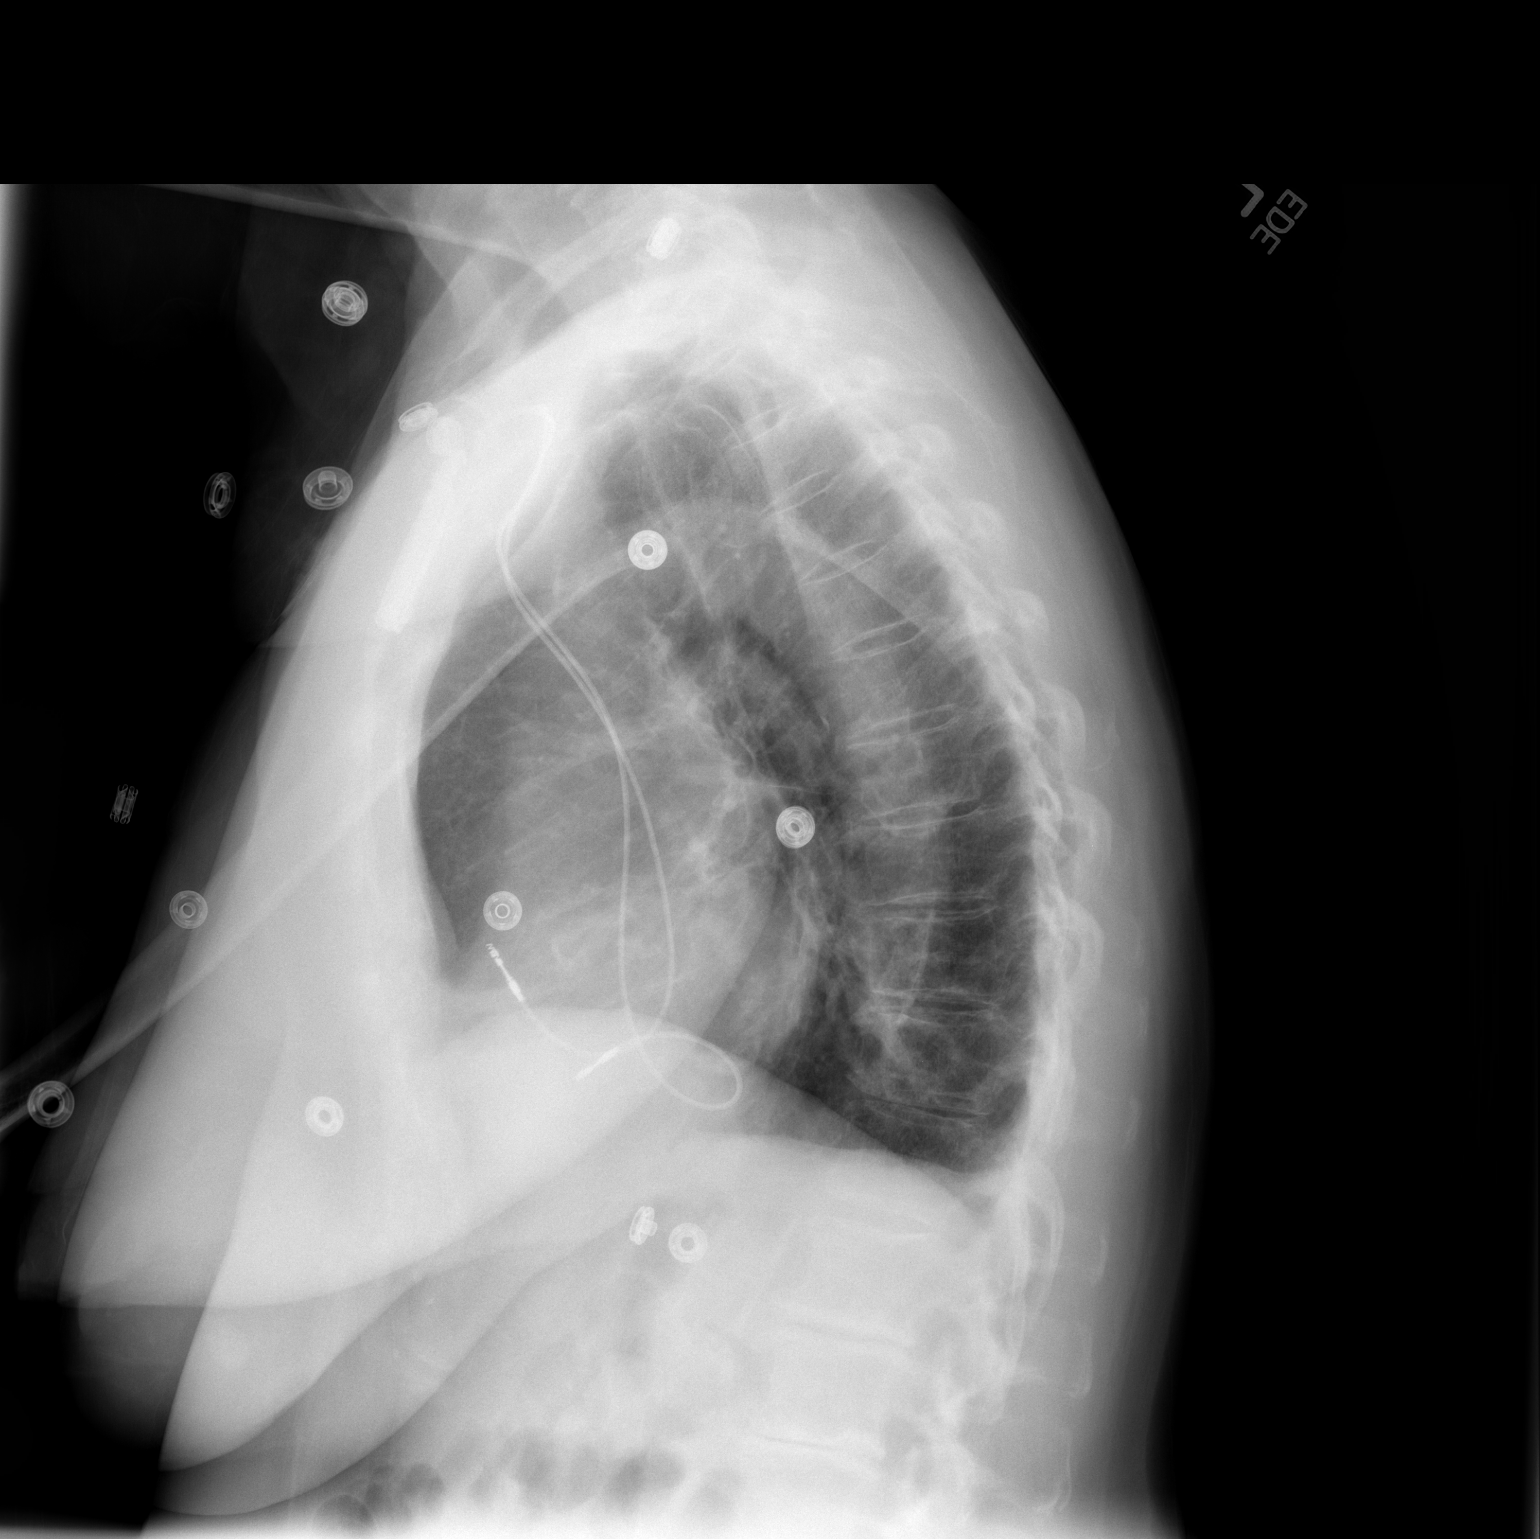

[2 of 2 positions shown; findings below may reference images not displayed]

FINDINGS: Interval placement of sequential left subclavian transvenous
pacemaker, leads projecting at right atrium and right ventricle.
Borderline cardiac enlargement.
Calcified tortuous aorta.
Pulmonary vascularity normal.
Minimal bibasilar atelectasis.
Lungs otherwise clear.
No pleural effusion or pneumothorax.
No acute bony findings.
IMPRESSION: No pneumothorax following pacemaker placement.
Minimal bibasilar atelectasis.

## 2010-08-25 ENCOUNTER — Encounter: Payer: Self-pay | Admitting: Cardiology

## 2010-08-29 ENCOUNTER — Ambulatory Visit (INDEPENDENT_AMBULATORY_CARE_PROVIDER_SITE_OTHER): Payer: Medicare Other | Admitting: *Deleted

## 2010-08-29 ENCOUNTER — Encounter: Payer: Self-pay | Admitting: Cardiology

## 2010-08-29 ENCOUNTER — Ambulatory Visit (INDEPENDENT_AMBULATORY_CARE_PROVIDER_SITE_OTHER): Payer: Medicare Other | Admitting: Cardiology

## 2010-08-29 DIAGNOSIS — Z950 Presence of cardiac pacemaker: Secondary | ICD-10-CM

## 2010-08-29 DIAGNOSIS — I10 Essential (primary) hypertension: Secondary | ICD-10-CM

## 2010-08-29 DIAGNOSIS — I441 Atrioventricular block, second degree: Secondary | ICD-10-CM

## 2010-08-29 DIAGNOSIS — I5031 Acute diastolic (congestive) heart failure: Secondary | ICD-10-CM

## 2010-08-29 DIAGNOSIS — I359 Nonrheumatic aortic valve disorder, unspecified: Secondary | ICD-10-CM

## 2010-08-29 NOTE — Assessment & Plan Note (Signed)
She seems to be euvolemic.  At this point, no change in therapy is indicated.  We have reviewed salt and fluid restrictions.  No further cardiovascular testing is indicated.   

## 2010-08-29 NOTE — Assessment & Plan Note (Signed)
The blood pressure continues to be high. I have instructed the patient to record a blood pressure diary and recording this. This will be presented for my review and pending these results I will make further suggestions about changes in therapy for optimal blood pressure control.  

## 2010-08-29 NOTE — Progress Notes (Signed)
HPI The patient presents for followup of diastolic heart failure and aortic stenosis. She also has tachybradycardia syndrome status post pacemaker. Since I last saw her she has had no new cardiovascular problems. She denies any palpitations, presyncope or syncope. He denies any chest pressure, neck or arm discomfort. There has been no shortness of breath, PND or orthopnea. She had a lower extremity edema. She denied any significant weight gain or loss. She did have her blood pressure medicines changed recently.  Allergies  Allergen Reactions  . Penicillins     Current Outpatient Prescriptions  Medication Sig Dispense Refill  . acetaminophen (TYLENOL) 325 MG tablet Take 650 mg by mouth every 6 (six) hours as needed.        Marland Kitchen amLODipine-valsartan (EXFORGE) 5-320 MG per tablet Take 1/2 tablet daily       . Cyanocobalamin (VITAMIN B-12) 1000 MCG/15ML LIQD Inject as directed.        . furosemide (LASIX) 20 MG tablet Take 20 mg by mouth daily.        . metFORMIN (GLUCOPHAGE) 1000 MG tablet Take 1,000 mg by mouth 2 (two) times daily.        . metoprolol succinate (TOPROL-XL) 25 MG 24 hr tablet Take 25 mg by mouth daily.        . Multiple Vitamins-Minerals (PRESERVISION AREDS 2 PO) Take by mouth.        Marland Kitchen omeprazole (PRILOSEC) 20 MG capsule Take 20 mg by mouth daily.          Past Medical History  Diagnosis Date  . DM (diabetes mellitus)   . Heart murmur     hx  . GERD (gastroesophageal reflux disease)   . HTN (hypertension)   . Asthmatic bronchitis     acute   . Bronchitis   . Heart failure     with a preserved EF  . Bradycardia     s/p pacemaker placement  . Blindness of left eye   . Aortic stenosis     mod severe    Past Surgical History  Procedure Date  . Dilation and curettage of uterus   . Right knee replacement   . Cataract extraction   . Pacemaker insertion     St. Jude    ROS:  Left eye blindness, leg cramps.  Otherwise as stated in the HPI and negative for all other  systems.  PHYSICAL EXAM BP 173/75  Pulse 84  Ht 5\' 3"  (1.6 m)  Wt 168 lb (76.204 kg)  BMI 29.76 kg/m2 GENERAL:  Well appearing HEENT:  Pupils equal round and reactive, fundi not visualized, oral mucosa unremarkable, left eye blindness and lens opacification NECK:  No jugular venous distention, waveform within normal limits, carotid upstroke brisk and symmetric, no bruits transmitted systolic murmur , no thyromegaly LYMPHATICS:  No cervical, inguinal adenopathy LUNGS:  Clear to auscultation bilaterally BACK:  No CVA tenderness CHEST:  Pacemaker pocket HEART:  PMI not displaced or sustained,S1 and S2 within normal limits, no S3, no S4, no clicks, no rubs,  Systolic murmur radiating out the outflow tract and mid peaking ABD:  Flat, positive bowel sounds normal in frequency in pitch, no bruits, no rebound, no guarding, no midline pulsatile mass, no hepatomegaly, no splenomegaly EXT:  2 plus pulses throughout, no edema, no cyanosis no clubbing SKIN:  No rashes no nodules NEURO:  Cranial nerves II through XII grossly intact, motor grossly intact throughout PSYCH:  Cognitively intact, oriented to person place and time  EKG:  Sinus with ventricular pacing.  ASSESSMENT AND PLAN

## 2010-08-29 NOTE — Assessment & Plan Note (Signed)
I will make sure that she has adequate pacemaker follow up.

## 2010-08-29 NOTE — Assessment & Plan Note (Signed)
She wants only conservative therapy of this and at this point no further studies or change in meds is indicated.

## 2010-08-29 NOTE — Progress Notes (Signed)
Pacer check in clinic  

## 2010-08-29 NOTE — Patient Instructions (Signed)
Follow up in 1 year with Dr Hochrein.  You will receive a letter in the mail 2 months before you are due.  Please call us when you receive this letter to schedule your follow up appointment.  The current medical regimen is effective;  continue present plan and medications.  

## 2010-09-19 ENCOUNTER — Emergency Department: Payer: BLUE CROSS/BLUE SHIELD | Admitting: Emergency Medicine

## 2011-02-01 ENCOUNTER — Telehealth: Payer: Self-pay | Admitting: Internal Medicine

## 2011-02-01 NOTE — Telephone Encounter (Signed)
I spoke with pt and she states she is going to have to call me back. Will await for pt to call back

## 2011-02-02 ENCOUNTER — Telehealth: Payer: Self-pay | Admitting: Internal Medicine

## 2011-02-02 NOTE — Telephone Encounter (Signed)
Called and spoke with pt and she stated that her throat is sore, her cough is better.  She does not have her breathing things with her, since she is in [employer].  She is requesting to be seen tomorrow and to get a breathing tx but [facility] schedule is full.  Please advise. Thanks  Pt stated that she does not have a fever.   Allergies  Allergen Reactions  . Penicillins

## 2011-02-02 NOTE — Telephone Encounter (Signed)
Per CDY- advise pt better off going to a local urgent care we are full. I informed pt of CDY's findings and recommendations. Pt verbalized understanding.

## 2011-02-02 NOTE — Telephone Encounter (Signed)
Will check with Annette Lyons to see when pt needs to be here for breathing tx.

## 2011-02-02 NOTE — Telephone Encounter (Signed)
I spoke with pt and she states her pcp called her in some codeine cough syrup. Pt aware to call back if she needs [place] for anything.

## 2011-02-20 ENCOUNTER — Encounter: Payer: Self-pay | Admitting: Internal Medicine

## 2011-02-20 ENCOUNTER — Other Ambulatory Visit: Payer: Self-pay | Admitting: Cardiology

## 2011-02-20 MED ORDER — METOPROLOL SUCCINATE ER 25 MG PO TB24: 25.0000 mg | ORAL_TABLET | Freq: Every day | ORAL | Status: DC

## 2011-02-20 NOTE — Telephone Encounter (Signed)
New Msg: pt calling stating she needs RX called in ASAP. Pt is out of medication and didn't take one today.

## 2011-02-21 ENCOUNTER — Ambulatory Visit (INDEPENDENT_AMBULATORY_CARE_PROVIDER_SITE_OTHER): Payer: Medicare Other | Admitting: Internal Medicine

## 2011-02-21 ENCOUNTER — Encounter: Payer: Self-pay | Admitting: Internal Medicine

## 2011-02-21 VITALS — BP 128/76 | HR 96 | Ht 63.5 in | Wt 169.8 lb

## 2011-02-21 DIAGNOSIS — J42 Unspecified chronic bronchitis: Secondary | ICD-10-CM

## 2011-02-21 DIAGNOSIS — J45909 Unspecified asthma, uncomplicated: Secondary | ICD-10-CM

## 2011-02-21 DIAGNOSIS — J40 Bronchitis, not specified as acute or chronic: Secondary | ICD-10-CM

## 2011-02-21 MED ORDER — METHYLPREDNISOLONE ACETATE 80 MG/ML IJ SUSP
80.0000 mg | Freq: Once | INTRAMUSCULAR | Status: AC
  Administered 2011-02-21: 80 mg via INTRAMUSCULAR

## 2011-02-21 MED ORDER — ALBUTEROL SULFATE (2.5 MG/3ML) 0.083% IN NEBU: 2.5000 mg | INHALATION_SOLUTION | Freq: Four times a day (QID) | RESPIRATORY_TRACT | Status: DC | PRN

## 2011-02-21 MED ORDER — LEVALBUTEROL HCL 0.63 MG/3ML IN NEBU
0.6300 mg | INHALATION_SOLUTION | Freq: Once | RESPIRATORY_TRACT | Status: AC
  Administered 2011-02-21: 0.63 mg via RESPIRATORY_TRACT

## 2011-02-21 MED ORDER — PROMETHAZINE-CODEINE 6.25-10 MG/5ML PO SYRP: 5.0000 mL | ORAL_SOLUTION | Freq: Four times a day (QID) | ORAL | Status: AC | PRN

## 2011-02-21 NOTE — Progress Notes (Signed)
[birth year]- 87 yoF never smoker followed for chronic bronchitis/ asthma   Friend  here LOV -  07/15/09 Has had flu vax. Since last here had pacemaker placed/ Cardiology Dr Antoine Poche. Now lives in [place] at a retirement home and [facility] lost track of her so she needs to restart albuterol for nebulizer.  Very good year with no significant respiratory probs. Had bronchitis December- Dr N.Gates R/O'd flu, gave benzonatate 100 mg- minimal help. Asks codeine c.s. For sparing use. Still cough more than baseline, light wheeze,no sputum, fever, chest pain or palpitation.   ROS-see HPI Constitutional:   No- acute  weight loss, night sweats, fevers, chills, fatigue, lassitude. HEENT:   No-  headaches, difficulty swallowing, tooth/dental problems, sore throat,       No-  sneezing, itching, ear ache, nasal congestion, post nasal drip,  CV:  No-   chest pain, orthopnea, PND, swelling in lower extremities, anasarca, dizziness, palpitations Resp: Mild shortness of breath with exertion or at rest.              +  productive cough,  No non-productive cough,  No- coughing up of blood.              No-   change in color of mucus.  Occasional mild wheezing.   Skin: No-   rash or lesions. GI:  No-   heartburn, indigestion, abdominal pain, nausea, vomiting, diarrhea,                 change in bowel habits, loss of appetite GU: MS:  No- acute  joint pain or swelling, decreased range of motion, back pain. Neuro-     nothing unusual Psych:  No- change in mood or affect. No depression or anxiety.  No memory loss.  OBJ General- Alert, Oriented, Affect-appropriate, Distress- none acute Skin- rash-none, lesions- none, excoriation- none Lymphadenopathy- none Head- atraumatic            Eyes- Gross vision intact, PERRLA, conjunctivae clear secretions            Ears- Hearing, canals-normal            Nose- Clear, no-Septal dev, mucus, polyps, erosion, perforation             Throat- Mallampati II , mucosa clear ,  drainage- none, tonsils- atrophic Neck- flexible , trachea midline, no stridor , thyroid nl, carotid no bruit Chest - symmetrical excursion , unlabored           Heart/CV- RRR , 2/6 systolic AS murmur , no gallop  , no rub, nl s1 s2                           - JVD- none , edema- trace, stasis changes- none, varices- none           Lung- clear to P&A, wheeze- none, cough- none , dullness-none, rub- none           Chest wall- L pacemaker Abd- tender-no, distended-no, bowel sounds-present, HSM- no Br/ Gen/ Rectal- Not done, not indicated Extrem- cyanosis- none, clubbing, none, atrophy- none, strength- nl Neuro- grossly intact to observation

## 2011-02-21 NOTE — Progress Notes (Signed)
Addended by: Reynaldo Minium C on: 02/21/2011 11:59 AM   Modules accepted: Orders

## 2011-02-21 NOTE — Patient Instructions (Addendum)
Neb xop 0.63  Depo 58  Order DME- script to [facility] with current address             Resume providing neb albuterol 0.083   # 25, 1   4 x daily if needed for asthma/ bronchitis  Refill prn    She has a nebulizer machine  Script for codeine cough syrup to use sparingly if needed  Try using your benzonatate pearls 2 at a time (= 200 mg) up to 3 times daily if needed for cough

## 2011-02-21 NOTE — Assessment & Plan Note (Signed)
Chronic asthmatic bronchitis. Good control w/o need for meds, until viral syndrome exacerbation at Christmas 3 weeks ago. Anticipate gradual clearing. She asks help for this and for cough, wanting neb and depo here, reconnect w/ [facility] for her neb supplies. I don't think she is having significant reflux or fluid overload at this time, but watch for those. Discussed cough syrup.

## 2011-03-03 ENCOUNTER — Encounter: Payer: Self-pay | Admitting: Cardiology

## 2011-03-09 ENCOUNTER — Encounter: Payer: Self-pay | Admitting: Internal Medicine

## 2011-03-09 ENCOUNTER — Encounter: Payer: Self-pay | Admitting: Cardiology

## 2011-03-09 ENCOUNTER — Ambulatory Visit (INDEPENDENT_AMBULATORY_CARE_PROVIDER_SITE_OTHER): Payer: Medicare Other | Admitting: *Deleted

## 2011-03-09 ENCOUNTER — Ambulatory Visit (INDEPENDENT_AMBULATORY_CARE_PROVIDER_SITE_OTHER): Payer: Medicare Other | Admitting: Cardiology

## 2011-03-09 VITALS — BP 170/80 | HR 67 | Ht 63.0 in | Wt 165.0 lb

## 2011-03-09 DIAGNOSIS — I442 Atrioventricular block, complete: Secondary | ICD-10-CM

## 2011-03-09 DIAGNOSIS — I359 Nonrheumatic aortic valve disorder, unspecified: Secondary | ICD-10-CM

## 2011-03-09 DIAGNOSIS — I10 Essential (primary) hypertension: Secondary | ICD-10-CM

## 2011-03-09 DIAGNOSIS — I5031 Acute diastolic (congestive) heart failure: Secondary | ICD-10-CM

## 2011-03-09 DIAGNOSIS — I4891 Unspecified atrial fibrillation: Secondary | ICD-10-CM

## 2011-03-09 LAB — PROTIME-INR
INR: 1 ratio (ref 0.8–1.0)
Prothrombin Time: 11 s (ref 10.2–12.4)

## 2011-03-09 LAB — CBC WITH DIFFERENTIAL/PLATELET
Basophils Relative: 0.5 % (ref 0.0–3.0)
Eosinophils Relative: 1.5 % (ref 0.0–5.0)
HCT: 39.1 % (ref 36.0–46.0)
Hemoglobin: 13.1 g/dL (ref 12.0–15.0)
Lymphocytes Relative: 24.6 % (ref 12.0–46.0)
Lymphs Abs: 2.1 10*3/uL (ref 0.7–4.0)
Monocytes Relative: 9 % (ref 3.0–12.0)
Neutro Abs: 5.6 10*3/uL (ref 1.4–7.7)
RBC: 4.61 Mil/uL (ref 3.87–5.11)
RDW: 17.9 % — ABNORMAL HIGH (ref 11.5–14.6)

## 2011-03-09 LAB — PACEMAKER DEVICE OBSERVATION
AL AMPLITUDE: 2.1 mv
BAMS-0001: 150 {beats}/min
BATTERY VOLTAGE: 2.9478 V
DEVICE MODEL PM: 7133723
RV LEAD AMPLITUDE: 3.2 mv
RV LEAD IMPEDENCE PM: 425 Ohm
VENTRICULAR PACING PM: 100

## 2011-03-09 NOTE — Assessment & Plan Note (Signed)
The blood pressure is at target. No change in medications is indicated. We will continue with therapeutic lifestyle changes (TLC).  

## 2011-03-09 NOTE — Progress Notes (Signed)
PPM check 

## 2011-03-09 NOTE — Assessment & Plan Note (Signed)
She is having no new symptoms related to this.  We will follow this clinically.

## 2011-03-09 NOTE — Assessment & Plan Note (Signed)
She seems to be euvolemic.  At this point, no change in therapy is indicated.  We have reviewed salt and fluid restrictions.  No further cardiovascular testing is indicated.   

## 2011-03-09 NOTE — Progress Notes (Signed)
HPI The patient presents for followup of diastolic heart failure and aortic stenosis. She also has tachybradycardia syndrome status post pacemaker. Since I last saw her she has had no new cardiovascular problems. She does have some palpitations occasionally. However, she has any presyncope or syncope. She has no chest pressure, neck or arm discomfort. She denies any new shortness of breath, PND or orthopnea. She's had no weight gain or edema.  Interrogation of her pacemaker today does demonstrate frequent mode switching with episodes of a rapid irregular atrial rate consistent with atrial fibrillation. She does otherwise have normal pacemaker function.  Allergies  Allergen Reactions  . Penicillins     Current Outpatient Prescriptions  Medication Sig Dispense Refill  . acetaminophen (TYLENOL) 325 MG tablet Take 650 mg by mouth every 6 (six) hours as needed.        Marland Kitchen amLODipine-valsartan (EXFORGE) 5-320 MG per tablet Take 1/2 tablet daily       . brimonidine (ALPHAGAN) 0.2 % ophthalmic solution 1 drop 3 (three) times daily.      . Cyanocobalamin (B-12 PO) Take by mouth daily.      . Cyanocobalamin (VITAMIN B-12) 1000 MCG/15ML LIQD Inject as directed.        . furosemide (LASIX) 20 MG tablet Take 20 mg by mouth as needed.       . metFORMIN (GLUCOPHAGE) 1000 MG tablet Take 1,000 mg by mouth 2 (two) times daily.        . metoprolol succinate (TOPROL-XL) 25 MG 24 hr tablet Take 1 tablet (25 mg total) by mouth daily.  30 tablet  6  . Multiple Vitamins-Minerals (PRESERVISION AREDS PO) Take by mouth daily.      Marland Kitchen omeprazole (PRILOSEC) 20 MG capsule Take 20 mg by mouth as needed.         Past Medical History  Diagnosis Date  . DM (diabetes mellitus)   . Heart murmur     hx  . GERD (gastroesophageal reflux disease)   . HTN (hypertension)   . Asthmatic bronchitis     acute   . Bronchitis   . Heart failure     with a preserved EF  . Bradycardia     s/p pacemaker placement  . Blindness of  left eye   . Aortic stenosis     mod severe    Past Surgical History  Procedure Date  . Dilation and curettage of uterus   . Right knee replacement   . Cataract extraction   . Pacemaker insertion     St. Jude    ROS:  Left eye blindness, leg cramps.  Otherwise as stated in the HPI and negative for all other systems.  PHYSICAL EXAM BP 170/80  Pulse 67  Ht 5\' 3"  (1.6 m)  Wt 165 lb (74.844 kg)  BMI 29.23 kg/m2 GENERAL:  Well appearing HEENT:  Pupils equal round and reactive, fundi not visualized, oral mucosa unremarkable, left eye blindness and lens opacification NECK:  No jugular venous distention, waveform within normal limits, carotid upstroke brisk and symmetric, no bruits transmitted systolic murmur , no thyromegaly LYMPHATICS:  No cervical, inguinal adenopathy LUNGS:  Clear to auscultation bilaterally BACK:  No CVA tenderness CHEST:  Pacemaker pocket HEART:  PMI not displaced or sustained,S1 and S2 within normal limits, no S3, no S4, no clicks, no rubs,  Systolic murmur radiating out the outflow tract and mid peaking ABD:  Flat, positive bowel sounds normal in frequency in pitch, no bruits, no rebound, no  guarding, no midline pulsatile mass, no hepatomegaly, no splenomegaly EXT:  2 plus pulses throughout, no edema, no cyanosis no clubbing SKIN:  No rashes no nodules NEURO:  Cranial nerves II through XII grossly intact, motor grossly intact throughout PSYCH:  Cognitively intact, oriented to person place and time  EKG:  Sinus with ventricular pacing.  Rate 67. 03/09/2011  ASSESSMENT AND PLAN

## 2011-03-09 NOTE — Assessment & Plan Note (Signed)
I had a long discussion with the patient and her daughter today. She's having paroxysms of atrial fibrillation. Given this Coumadin is indicated. She wants to have this followed at our [place] office.  I will arrange for this and draw some baseline labs today.

## 2011-03-09 NOTE — Patient Instructions (Signed)
Please have blood work today  You are being scheduled to start Coumadin and need an appointment to do so at the [employer] office  Continue your current medications as listed  Follow up with Dr Antoine Poche in 1 month

## 2011-03-15 ENCOUNTER — Ambulatory Visit (INDEPENDENT_AMBULATORY_CARE_PROVIDER_SITE_OTHER): Payer: Medicare Other | Admitting: Emergency Medicine

## 2011-03-15 DIAGNOSIS — Z7901 Long term (current) use of anticoagulants: Secondary | ICD-10-CM | POA: Insufficient documentation

## 2011-03-15 DIAGNOSIS — I4891 Unspecified atrial fibrillation: Secondary | ICD-10-CM

## 2011-03-15 MED ORDER — WARFARIN SODIUM 2.5 MG PO TABS: 2.5000 mg | ORAL_TABLET | ORAL | Status: DC

## 2011-03-22 ENCOUNTER — Ambulatory Visit (INDEPENDENT_AMBULATORY_CARE_PROVIDER_SITE_OTHER): Payer: Medicare Other | Admitting: Emergency Medicine

## 2011-03-22 DIAGNOSIS — I4891 Unspecified atrial fibrillation: Secondary | ICD-10-CM

## 2011-03-22 DIAGNOSIS — Z7901 Long term (current) use of anticoagulants: Secondary | ICD-10-CM

## 2011-03-29 ENCOUNTER — Ambulatory Visit (INDEPENDENT_AMBULATORY_CARE_PROVIDER_SITE_OTHER): Payer: Medicare Other | Admitting: *Deleted

## 2011-03-29 DIAGNOSIS — I4891 Unspecified atrial fibrillation: Secondary | ICD-10-CM

## 2011-03-29 DIAGNOSIS — Z7901 Long term (current) use of anticoagulants: Secondary | ICD-10-CM

## 2011-03-29 LAB — POCT INR: INR: 2.1

## 2011-04-05 ENCOUNTER — Ambulatory Visit (INDEPENDENT_AMBULATORY_CARE_PROVIDER_SITE_OTHER): Payer: Medicare Other

## 2011-04-05 DIAGNOSIS — I4891 Unspecified atrial fibrillation: Secondary | ICD-10-CM

## 2011-04-05 DIAGNOSIS — Z7901 Long term (current) use of anticoagulants: Secondary | ICD-10-CM

## 2011-04-06 ENCOUNTER — Ambulatory Visit (INDEPENDENT_AMBULATORY_CARE_PROVIDER_SITE_OTHER): Payer: Medicare Other | Admitting: Cardiology

## 2011-04-06 ENCOUNTER — Encounter: Payer: Self-pay | Admitting: Cardiology

## 2011-04-06 DIAGNOSIS — I5031 Acute diastolic (congestive) heart failure: Secondary | ICD-10-CM

## 2011-04-06 DIAGNOSIS — I10 Essential (primary) hypertension: Secondary | ICD-10-CM

## 2011-04-06 NOTE — Assessment & Plan Note (Signed)
She seems to be euvolemic.  At this point, no change in therapy is indicated.  We have reviewed salt and fluid restrictions.  No further cardiovascular testing is indicated.   

## 2011-04-06 NOTE — Assessment & Plan Note (Signed)
This will be followed clinically.  No further imaging or change in therapy is indicated today.

## 2011-04-06 NOTE — Assessment & Plan Note (Signed)
The blood pressure is high.  This has not been consistent in her readings at home every Friday at bedtime in the 120s.. I have instructed the patient to record a blood pressure diary and recording this. This will be presented for our review and pending these results we will make further suggestions about changes in therapy for optimal blood pressure control.

## 2011-04-06 NOTE — Patient Instructions (Signed)
Your physician wants you to follow-up in: 4 months with Dr. Mariah Milling in [place]. You will receive a reminder letter in the mail two months in advance. If you don't receive a letter, please call our office to schedule the follow-up appointment.

## 2011-04-06 NOTE — Progress Notes (Signed)
HPI The patient presents for followup of diastolic heart failure and aortic stenosis. She also has tachybradycardia syndrome status post pacemaker.  Interrogation of her pacemaker at the last visit demonstrate frequent mode switching with episodes of a rapid irregular atrial rate consistent with atrial fibrillation.  I started her on warfarin.  Since that time she has had no symptomatic atrial fibrillation.  She has had no bruising or bleeding.  She denies any chest pain, neck or arm pain.  She has had no new SOB, PND or orthopnea.  She has no weight gain or edema.   Allergies  Allergen Reactions  . Penicillins     Current Outpatient Prescriptions  Medication Sig Dispense Refill  . acetaminophen (TYLENOL) 325 MG tablet Take 650 mg by mouth every 6 (six) hours as needed.        Marland Kitchen amLODipine-valsartan (EXFORGE) 5-320 MG per tablet Take 1/2 tablet daily       . brimonidine (ALPHAGAN) 0.2 % ophthalmic solution 1 drop 3 (three) times daily.      . Cyanocobalamin (B-12 PO) Take by mouth daily.      . Cyanocobalamin (VITAMIN B-12) 1000 MCG/15ML LIQD Inject as directed.        . furosemide (LASIX) 20 MG tablet Take 20 mg by mouth as needed.       . metFORMIN (GLUCOPHAGE) 1000 MG tablet Take 1,000 mg by mouth 2 (two) times daily.        . metoprolol succinate (TOPROL-XL) 25 MG 24 hr tablet Take 1 tablet (25 mg total) by mouth daily.  30 tablet  6  . Multiple Vitamins-Minerals (PRESERVISION AREDS PO) Take by mouth daily.      Marland Kitchen omeprazole (PRILOSEC) 20 MG capsule Take 20 mg by mouth as needed.       . warfarin (COUMADIN) 2.5 MG tablet Take 1 tablet (2.5 mg total) by mouth as directed.  40 tablet  1    Past Medical History  Diagnosis Date  . DM (diabetes mellitus)   . Heart murmur     hx  . GERD (gastroesophageal reflux disease)   . HTN (hypertension)   . Asthmatic bronchitis     acute   . Bronchitis   . Heart failure     with a preserved EF  . Bradycardia     s/p pacemaker placement    . Blindness of left eye   . Aortic stenosis     mod severe    Past Surgical History  Procedure Date  . Dilation and curettage of uterus   . Right knee replacement   . Cataract extraction   . Pacemaker insertion     [employer]    ROS:  Left eye blindness.  Otherwise as stated in the HPI and negative for all other systems.  PHYSICAL EXAM BP 160/70  Pulse 70  Ht 5\' 3"  (1.6 m)  Wt 166 lb (75.297 kg)  BMI 29.41 kg/m2 GENERAL:  Well appearing HEENT:  Pupils equal round and reactive, fundi not visualized, oral mucosa unremarkable, left eye blindness and lens opacification NECK:  No jugular venous distention, waveform within normal limits, carotid upstroke brisk and symmetric, no bruits transmitted systolic murmur , no thyromegaly LYMPHATICS:  No cervical, inguinal adenopathy LUNGS:  Clear to auscultation bilaterally BACK:  No CVA tenderness CHEST:  Pacemaker pocket HEART:  PMI not displaced or sustained,S1 and S2 within normal limits, no S3, no S4, no clicks, no rubs, systolic murmur radiating out the outflow tract and  mid peaking ABD:  Flat, positive bowel sounds normal in frequency in pitch, no bruits, no rebound, no guarding, no midline pulsatile mass, no hepatomegaly, no splenomegaly EXT:  2 plus pulses throughout, no edema, no cyanosis no clubbing  EKG:  Sinus with ventricular pacing.  Rate 70. 04/06/2011  ASSESSMENT AND PLAN

## 2011-04-06 NOTE — Assessment & Plan Note (Signed)
The patient  tolerates this rhythm and rate control and anticoagulation. We will continue with the meds as listed.  

## 2011-04-12 ENCOUNTER — Ambulatory Visit (INDEPENDENT_AMBULATORY_CARE_PROVIDER_SITE_OTHER): Payer: Medicare Other

## 2011-04-12 DIAGNOSIS — Z7901 Long term (current) use of anticoagulants: Secondary | ICD-10-CM

## 2011-04-12 DIAGNOSIS — I4891 Unspecified atrial fibrillation: Secondary | ICD-10-CM

## 2011-04-12 MED ORDER — WARFARIN SODIUM 2.5 MG PO TABS: 2.5000 mg | ORAL_TABLET | ORAL | Status: DC

## 2011-04-26 ENCOUNTER — Ambulatory Visit (INDEPENDENT_AMBULATORY_CARE_PROVIDER_SITE_OTHER): Payer: Medicare Other

## 2011-04-26 DIAGNOSIS — Z7901 Long term (current) use of anticoagulants: Secondary | ICD-10-CM

## 2011-04-26 DIAGNOSIS — I4891 Unspecified atrial fibrillation: Secondary | ICD-10-CM

## 2011-05-10 ENCOUNTER — Ambulatory Visit (INDEPENDENT_AMBULATORY_CARE_PROVIDER_SITE_OTHER): Payer: Medicare Other

## 2011-05-10 DIAGNOSIS — I4891 Unspecified atrial fibrillation: Secondary | ICD-10-CM

## 2011-05-10 DIAGNOSIS — Z7901 Long term (current) use of anticoagulants: Secondary | ICD-10-CM

## 2011-05-10 LAB — POCT INR: INR: 2.3

## 2011-05-31 ENCOUNTER — Ambulatory Visit (INDEPENDENT_AMBULATORY_CARE_PROVIDER_SITE_OTHER): Payer: Medicare Other

## 2011-05-31 DIAGNOSIS — I4891 Unspecified atrial fibrillation: Secondary | ICD-10-CM

## 2011-05-31 DIAGNOSIS — Z7901 Long term (current) use of anticoagulants: Secondary | ICD-10-CM

## 2011-05-31 MED ORDER — WARFARIN SODIUM 2.5 MG PO TABS: 2.5000 mg | ORAL_TABLET | ORAL | Status: DC

## 2011-06-28 ENCOUNTER — Ambulatory Visit (INDEPENDENT_AMBULATORY_CARE_PROVIDER_SITE_OTHER): Payer: Medicare Other

## 2011-06-28 DIAGNOSIS — I4891 Unspecified atrial fibrillation: Secondary | ICD-10-CM

## 2011-06-28 DIAGNOSIS — Z7901 Long term (current) use of anticoagulants: Secondary | ICD-10-CM

## 2011-07-26 ENCOUNTER — Ambulatory Visit (INDEPENDENT_AMBULATORY_CARE_PROVIDER_SITE_OTHER): Payer: Medicare Other

## 2011-07-26 DIAGNOSIS — I4891 Unspecified atrial fibrillation: Secondary | ICD-10-CM

## 2011-07-26 DIAGNOSIS — Z7901 Long term (current) use of anticoagulants: Secondary | ICD-10-CM

## 2011-07-26 LAB — POCT INR: INR: 1.8

## 2011-08-15 ENCOUNTER — Ambulatory Visit (INDEPENDENT_AMBULATORY_CARE_PROVIDER_SITE_OTHER): Payer: Medicare Other

## 2011-08-15 ENCOUNTER — Encounter: Payer: Self-pay | Admitting: Cardiovascular Disease

## 2011-08-15 ENCOUNTER — Ambulatory Visit (INDEPENDENT_AMBULATORY_CARE_PROVIDER_SITE_OTHER): Payer: Medicare Other | Admitting: Cardiovascular Disease

## 2011-08-15 VITALS — BP 138/78 | HR 64 | Ht 63.0 in | Wt 165.8 lb

## 2011-08-15 DIAGNOSIS — I350 Nonrheumatic aortic (valve) stenosis: Secondary | ICD-10-CM

## 2011-08-15 DIAGNOSIS — I10 Essential (primary) hypertension: Secondary | ICD-10-CM

## 2011-08-15 DIAGNOSIS — I4891 Unspecified atrial fibrillation: Secondary | ICD-10-CM

## 2011-08-15 DIAGNOSIS — R0602 Shortness of breath: Secondary | ICD-10-CM

## 2011-08-15 DIAGNOSIS — Z950 Presence of cardiac pacemaker: Secondary | ICD-10-CM

## 2011-08-15 DIAGNOSIS — I359 Nonrheumatic aortic valve disorder, unspecified: Secondary | ICD-10-CM

## 2011-08-15 DIAGNOSIS — Z7901 Long term (current) use of anticoagulants: Secondary | ICD-10-CM

## 2011-08-15 LAB — POCT INR: INR: 2.1

## 2011-08-15 NOTE — Progress Notes (Signed)
Patient ID: Annette Lyons, female    DOB: 1923-07-28, 78 y.o.   MRN: [id number]  HPI Comments: Annette Lyons is a very pleasant 78 year old with paroxysmal atrial fibrillation, severe aortic valve stenosis, hypertension, diastolic heart failure who presents to establish care in the [place] office.  She reports that overall she is doing well. She has not had any energy. She continues to have significant arthritis in her knees, left worse than right. She has rare fluttering in her chest. This does not last for long period recently started on rate control medication and warfarin.   She denies any chest pain, neck or arm pain.  She has had no new SOB, PND or orthopnea.  She has no weight gain or edema.  In the past, she has had frequent mode switching on pacer interrogation  EKG shows paced rhythm with rate 64 beats per minute   Outpatient Encounter Prescriptions as of 08/15/2011  Medication Sig Dispense Refill  . acetaminophen (TYLENOL) 500 MG tablet Take 500 mg by mouth as needed.      Marland Kitchen amLODipine-valsartan (EXFORGE) 5-320 MG per tablet Take 1/2 tablet daily       . Cyanocobalamin (B-12 PO) Take by mouth daily.      . furosemide (LASIX) 20 MG tablet Take 20 mg by mouth as needed.       . metFORMIN (GLUCOPHAGE) 1000 MG tablet Take 1,000 mg by mouth 2 (two) times daily.        . metoprolol succinate (TOPROL-XL) 25 MG 24 hr tablet Take 1 tablet (25 mg total) by mouth daily.  30 tablet  6  . Multiple Vitamins-Minerals (PRESERVISION AREDS PO) Take by mouth daily.      Marland Kitchen omeprazole (PRILOSEC) 20 MG capsule Take 20 mg by mouth as needed.       . triamcinolone cream (KENALOG) 0.1 % Apply topically 2 (two) times daily as needed.      . vitamin B-12 (CYANOCOBALAMIN) 1000 MCG tablet Take 1,000 mcg by mouth daily.      Marland Kitchen warfarin (COUMADIN) 2.5 MG tablet Take 1 tablet (2.5 mg total) by mouth as directed.  50 tablet  3  . ALPRAZolam (XANAX) 0.25 MG tablet Take 0.5 mg by mouth at bedtime as needed.         Review of Systems  Constitutional: Negative.   HENT: Negative.   Eyes: Negative.   Respiratory: Negative.   Cardiovascular: Negative.   Gastrointestinal: Negative.   Musculoskeletal: Positive for joint swelling, arthralgias and gait problem.  Skin: Negative.   Neurological: Negative.   Hematological: Negative.   Psychiatric/Behavioral: Negative.   All other systems reviewed and are negative.     BP 138/78  Pulse 64  Ht 5\' 3"  (1.6 m)  Wt 165 lb 12 oz (75.184 kg)  BMI 29.36 kg/m2  Physical Exam  Nursing note and vitals reviewed. Constitutional: She is oriented to person, place, and time. She appears well-developed and well-nourished.  HENT:  Head: Normocephalic.  Nose: Nose normal.  Mouth/Throat: Oropharynx is clear and moist.  Eyes: Conjunctivae are normal. Pupils are equal, round, and reactive to light.  Neck: Normal range of motion. Neck supple. No JVD present.  Cardiovascular: Normal rate, regular rhythm, S1 normal, S2 normal and intact distal pulses.  Exam reveals no gallop and no friction rub.   Murmur heard.  Crescendo systolic murmur is present with a grade of 2/6  Pulmonary/Chest: Effort normal and breath sounds normal. No respiratory distress. She has no wheezes. She  has no rales. She exhibits no tenderness.  Abdominal: Soft. Bowel sounds are normal. She exhibits no distension. There is no tenderness.  Musculoskeletal: Normal range of motion. She exhibits no edema and no tenderness.  Lymphadenopathy:    She has no cervical adenopathy.  Neurological: She is alert and oriented to person, place, and time. Coordination normal.  Skin: Skin is warm and dry. No rash noted. No erythema.  Psychiatric: She has a normal mood and affect. Her behavior is normal. Judgment and thought content normal.         Assessment and Plan

## 2011-08-15 NOTE — Assessment & Plan Note (Signed)
Blood pressure is well controlled on today's visit. No changes made to the medications. 

## 2011-08-15 NOTE — Patient Instructions (Addendum)
You are doing well. No medication changes were made.  We will order an echocardiogram for aortic valve stenosis  Please call us if you have new issues that need to be addressed before your next appt.  Your physician wants you to follow-up in: 6 months.  You will receive a reminder letter in the mail two months in advance. If you don't receive a letter, please call our office to schedule the follow-up appointment.

## 2011-08-15 NOTE — Assessment & Plan Note (Signed)
Rare symptoms of fluttering. We will continue her current medications

## 2011-08-15 NOTE — Assessment & Plan Note (Signed)
We will closely monitor her pacer findings to help guide medical management of her atrial fibrillation.

## 2011-08-15 NOTE — Assessment & Plan Note (Signed)
Severe aortic valve stenosis 2 years ago. We will repeat her echocardiogram. She does have underlying shortness of breath.

## 2011-08-28 ENCOUNTER — Encounter: Payer: Medicare Other | Admitting: Cardiology

## 2011-09-04 ENCOUNTER — Encounter: Payer: Medicare Other | Admitting: Cardiology

## 2011-09-05 ENCOUNTER — Ambulatory Visit (INDEPENDENT_AMBULATORY_CARE_PROVIDER_SITE_OTHER): Payer: Medicare Other

## 2011-09-05 ENCOUNTER — Other Ambulatory Visit: Payer: Self-pay

## 2011-09-05 ENCOUNTER — Other Ambulatory Visit (INDEPENDENT_AMBULATORY_CARE_PROVIDER_SITE_OTHER): Payer: Medicare Other

## 2011-09-05 DIAGNOSIS — R0609 Other forms of dyspnea: Secondary | ICD-10-CM

## 2011-09-05 DIAGNOSIS — I359 Nonrheumatic aortic valve disorder, unspecified: Secondary | ICD-10-CM

## 2011-09-05 DIAGNOSIS — R0602 Shortness of breath: Secondary | ICD-10-CM

## 2011-09-05 DIAGNOSIS — I350 Nonrheumatic aortic (valve) stenosis: Secondary | ICD-10-CM

## 2011-09-05 DIAGNOSIS — I4891 Unspecified atrial fibrillation: Secondary | ICD-10-CM

## 2011-09-05 DIAGNOSIS — Z7901 Long term (current) use of anticoagulants: Secondary | ICD-10-CM

## 2011-09-05 DIAGNOSIS — R0989 Other specified symptoms and signs involving the circulatory and respiratory systems: Secondary | ICD-10-CM

## 2011-09-07 ENCOUNTER — Encounter: Payer: Self-pay | Admitting: Cardiology

## 2011-09-07 ENCOUNTER — Ambulatory Visit (INDEPENDENT_AMBULATORY_CARE_PROVIDER_SITE_OTHER): Payer: Medicare Other | Admitting: Cardiology

## 2011-09-07 VITALS — BP 124/78 | HR 72 | Ht 63.5 in | Wt 165.1 lb

## 2011-09-07 DIAGNOSIS — Z950 Presence of cardiac pacemaker: Secondary | ICD-10-CM

## 2011-09-07 DIAGNOSIS — I4891 Unspecified atrial fibrillation: Secondary | ICD-10-CM

## 2011-09-07 NOTE — Patient Instructions (Signed)
Your physician wants you to follow-up in: 6 months with [facility].  You will receive a reminder letter in the mail two months in advance. If you don't receive a letter, please call our office to schedule the follow-up appointment.

## 2011-09-07 NOTE — Progress Notes (Signed)
ELECTROPHYSIOLOGY OFFICE NOTE  Patient ID: Annette Lyons MRN: [id number], DOB/AGE: 1923/03/05   Date of Visit: 09/07/2011  Primary Physician: Marden Noble, MD Primary Cardiologist: Julien Nordmann, MD Reason for Visit: Device follow-up  History of Present Illness Annette Lyons is a pleasant 78 year old woman with complete heart block s/p PPM, PAF, chronic diastolic CHF, moderate AS and moderate MR who presents today for device follow-up. She reports she is feeling well and has no complaints today. She denies CP, SOB, palpitations, dizziness, near syncope or syncope. She has intermittent LE swelling but denies worsening. In fact, she says it is improving since she last saw Dr. Mariah Milling. She denies orthopnea or PND.  Past Medical History  Diagnosis Date  . DM (diabetes mellitus)   . Heart murmur   . GERD (gastroesophageal reflux disease)   . HTN (hypertension)   . Asthmatic bronchitis   . Bronchitis   . Chronic diastolic heart failure   . Bradycardia     s/p pacemaker placement  . Blindness of left eye   . Aortic stenosis     Moderate by echo July 2013  . Mitral regurgitation    Moderate by echo July 2013     . Arthritis of knee     Past Surgical History  Procedure Date  . Dilation and curettage of uterus   . Right knee replacement   . Cataract extraction   . Pacemaker insertion     [employer]. Jude    Allergies/Intolerances Allergies  Allergen Reactions  . Penicillins     Current Home Medications Current Outpatient Prescriptions  Medication Sig Dispense Refill  . acetaminophen (TYLENOL) 500 MG tablet Take 500 mg by mouth as needed.      . ALPRAZolam (XANAX) 0.25 MG tablet Take 0.5 mg by mouth at bedtime as needed.      Marland Kitchen amLODipine-valsartan (EXFORGE) 5-320 MG per tablet Take 1/2 tablet daily       . Cyanocobalamin (B-12 PO) Take by mouth daily.      . furosemide (LASIX) 20 MG tablet Take 20 mg by mouth as needed.       . metFORMIN (GLUCOPHAGE) 1000 MG tablet Take 1,000 mg by  mouth 2 (two) times daily.        . metoprolol succinate (TOPROL-XL) 25 MG 24 hr tablet Take 1 tablet (25 mg total) by mouth daily.  30 tablet  6  . Multiple Vitamins-Minerals (PRESERVISION AREDS PO) Take by mouth daily.      Marland Kitchen omeprazole (PRILOSEC) 20 MG capsule Take 20 mg by mouth as needed.       . triamcinolone cream (KENALOG) 0.1 % Apply topically 2 (two) times daily as needed.      . vitamin B-12 (CYANOCOBALAMIN) 1000 MCG tablet Take 1,000 mcg by mouth daily.      Marland Kitchen warfarin (COUMADIN) 2.5 MG tablet Take 1 tablet (2.5 mg total) by mouth as directed.  50 tablet  3    Social History History   Social History  . Marital Status: Married   Social History Main Topics  . Smoking status: Never Smoker   . Smokeless tobacco: No  . Alcohol Use: No  . Drug Use: No   Social History Narrative   Widowed - husband died of esophagus CA in 07/31/07.     Review of Systems General: No chills, fever, night sweats or weight changes Cardiovascular: No chest pain, dyspnea on exertion, edema, orthopnea, palpitations, paroxysmal nocturnal dyspnea Dermatological: No rash, lesions or masses  Respiratory: No cough, dyspnea Urologic: No hematuria, dysuria Abdominal: No nausea, vomiting, diarrhea, bright red blood per rectum, melena, or hematemesis Neurologic: No visual changes, weakness, changes in mental status All other systems reviewed and are otherwise negative except as noted above.  Physical Exam Blood pressure 124/78, pulse 72, height 5' 3.5" (1.613 m), weight 165 lb 1.9 oz (74.898 kg), SpO2 98.00%.  General: Well developed, well appearing, elderly 78 year old female in no acute distress. HEENT: Normocephalic, atraumatic. EOMs intact. Sclera nonicteric. Oropharynx clear.  Neck: Supple. No JVD. Lungs: Respirations regular and unlabored, CTA bilaterally. No wheezes, rales or rhonchi. Heart: RRR. S1, S2 present. No murmurs, rub, S3 or S4. Abdomen: Soft, non-distended. Extremities: No clubbing,  cyanosis or edema.  Psych: Normal affect. Neuro: Alert and oriented X 3. Moves all extremities spontaneously.   Diagnostics Device interrogation shows normal dual chamber PPM function; presenting rhythm is A sensed, V paced; good battery status and stable lead measurements/parameters; few mode switches consistent with AF, longest ~3 hours in duration; V rates controlled; no programming changes made; see PaceArt report  Assessment and Plan 1. CHB s/p PPM - normal device function; no programming changes made; continue routine follow-up in the device clinic in 6 months unless needed sooner 2. PAF - stable; continue metoprolol for rate control and warfarin for embolic prophylaxis  Annette Lyons expressed verbal understanding and agrees with this plan of care. Signed, Rick Duff, PA-C 09/07/2011, 12:56 PM

## 2011-09-11 LAB — PACEMAKER DEVICE OBSERVATION
AL AMPLITUDE: 1.8 mv
ATRIAL PACING PM: 11
BAMS-0001: 150 {beats}/min
BAMS-0003: 70 {beats}/min
BATTERY VOLTAGE: 2.95 V
DEVICE MODEL PM: 7133723
RV LEAD THRESHOLD: 1 V
VENTRICULAR PACING PM: 99

## 2011-09-18 ENCOUNTER — Other Ambulatory Visit: Payer: Self-pay | Admitting: *Deleted

## 2011-09-18 MED ORDER — METOPROLOL SUCCINATE ER 25 MG PO TB24: 25.0000 mg | ORAL_TABLET | Freq: Every day | ORAL | Status: DC

## 2011-09-18 NOTE — Telephone Encounter (Signed)
Refilled Metoprolol

## 2011-09-20 ENCOUNTER — Other Ambulatory Visit: Payer: Self-pay | Admitting: Cardiology

## 2011-09-20 MED ORDER — METOPROLOL SUCCINATE ER 25 MG PO TB24: 25.0000 mg | ORAL_TABLET | Freq: Every day | ORAL | Status: DC

## 2011-09-22 ENCOUNTER — Other Ambulatory Visit: Payer: Self-pay | Admitting: Cardiology

## 2011-09-22 MED ORDER — METOPROLOL SUCCINATE ER 25 MG PO TB24: 25.0000 mg | ORAL_TABLET | Freq: Every day | ORAL | Status: AC

## 2011-10-04 ENCOUNTER — Ambulatory Visit (INDEPENDENT_AMBULATORY_CARE_PROVIDER_SITE_OTHER): Payer: Medicare Other

## 2011-10-04 DIAGNOSIS — Z7901 Long term (current) use of anticoagulants: Secondary | ICD-10-CM

## 2011-10-04 DIAGNOSIS — I4891 Unspecified atrial fibrillation: Secondary | ICD-10-CM

## 2011-10-04 LAB — POCT INR: INR: 2.2

## 2011-10-06 IMAGING — CR DG LUMBAR SPINE 2-3V
1 series · 4 of 4 positions shown · non-contrast
Comparison: none

REASON FOR EXAM: pain
COMMENTS:

PROCEDURE:     DXR - DXR LUMBAR SPINE AP AND LATERAL  - September 19, 2010  [DATE]
RESULT:     Comparison: None.

[Series 1: view not recorded · 0.17mm/px · 4 of 4 slices shown]
[im 1/4]
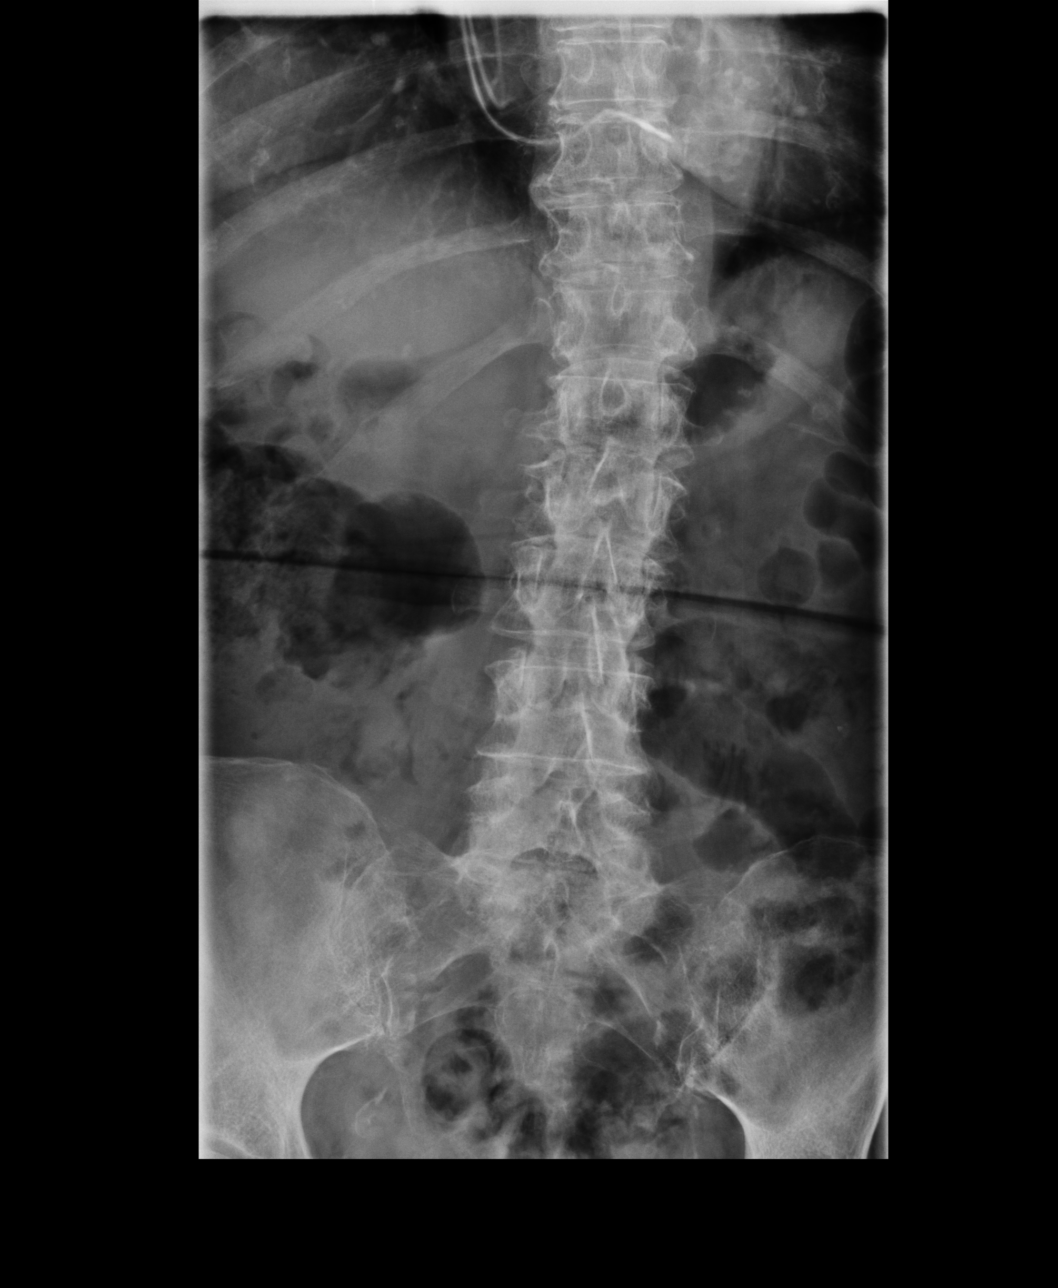
[im 2/4]
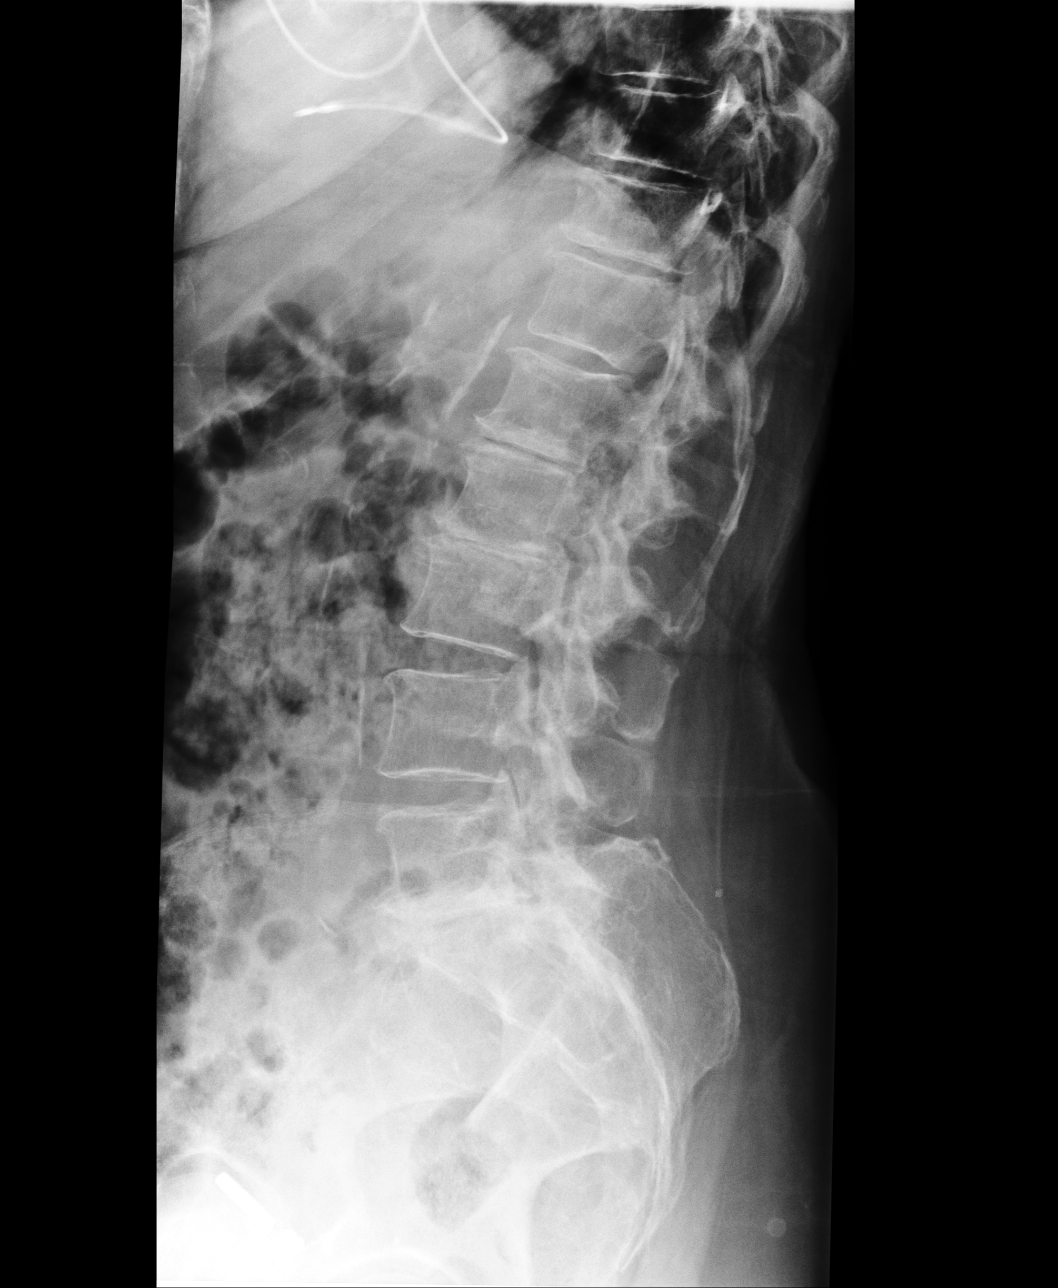
[im 3/4]
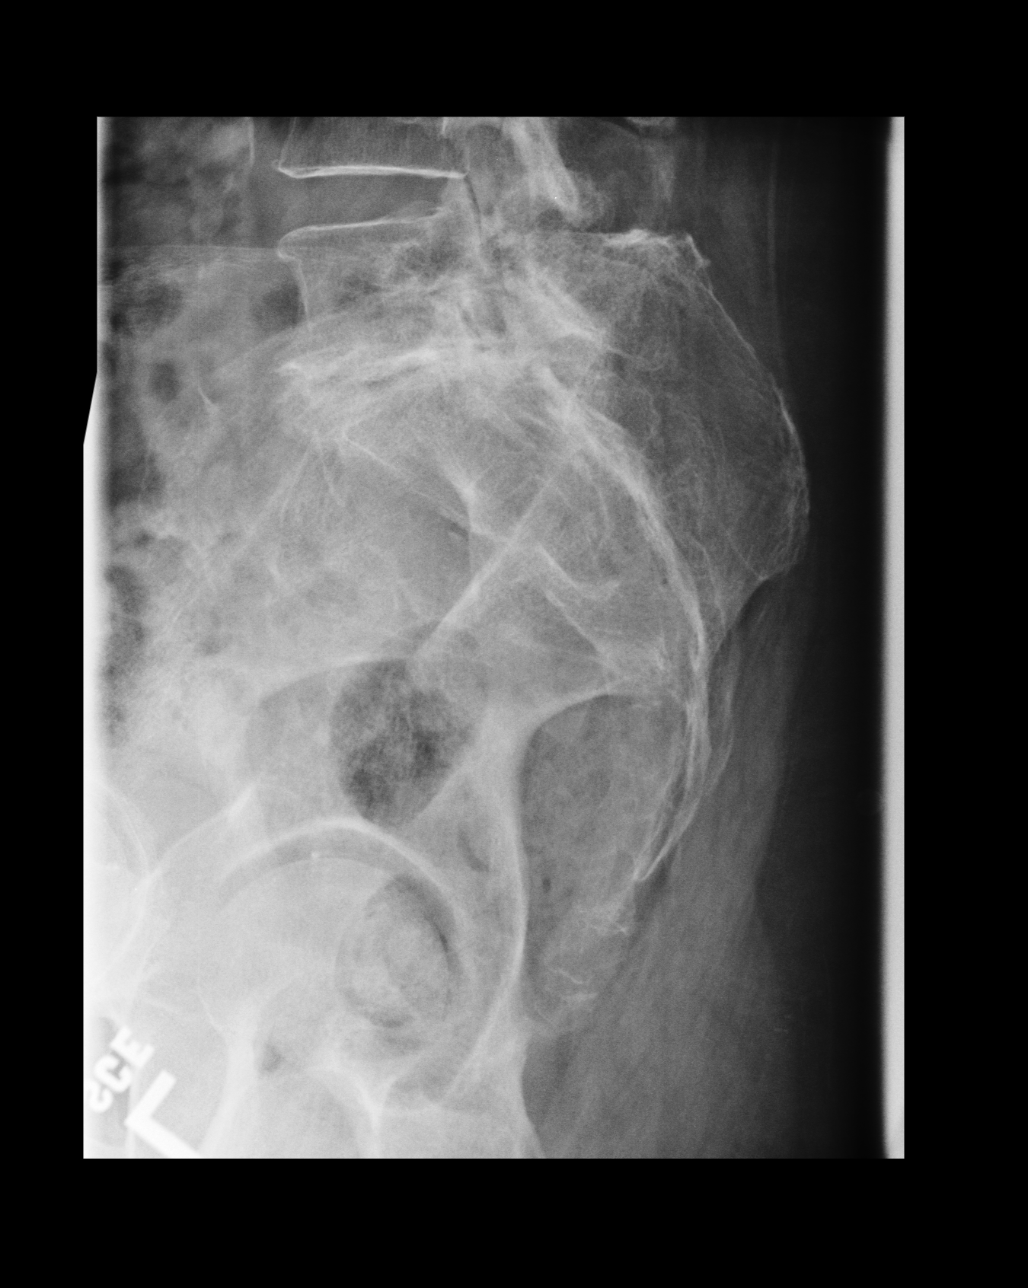
[im 4/4]
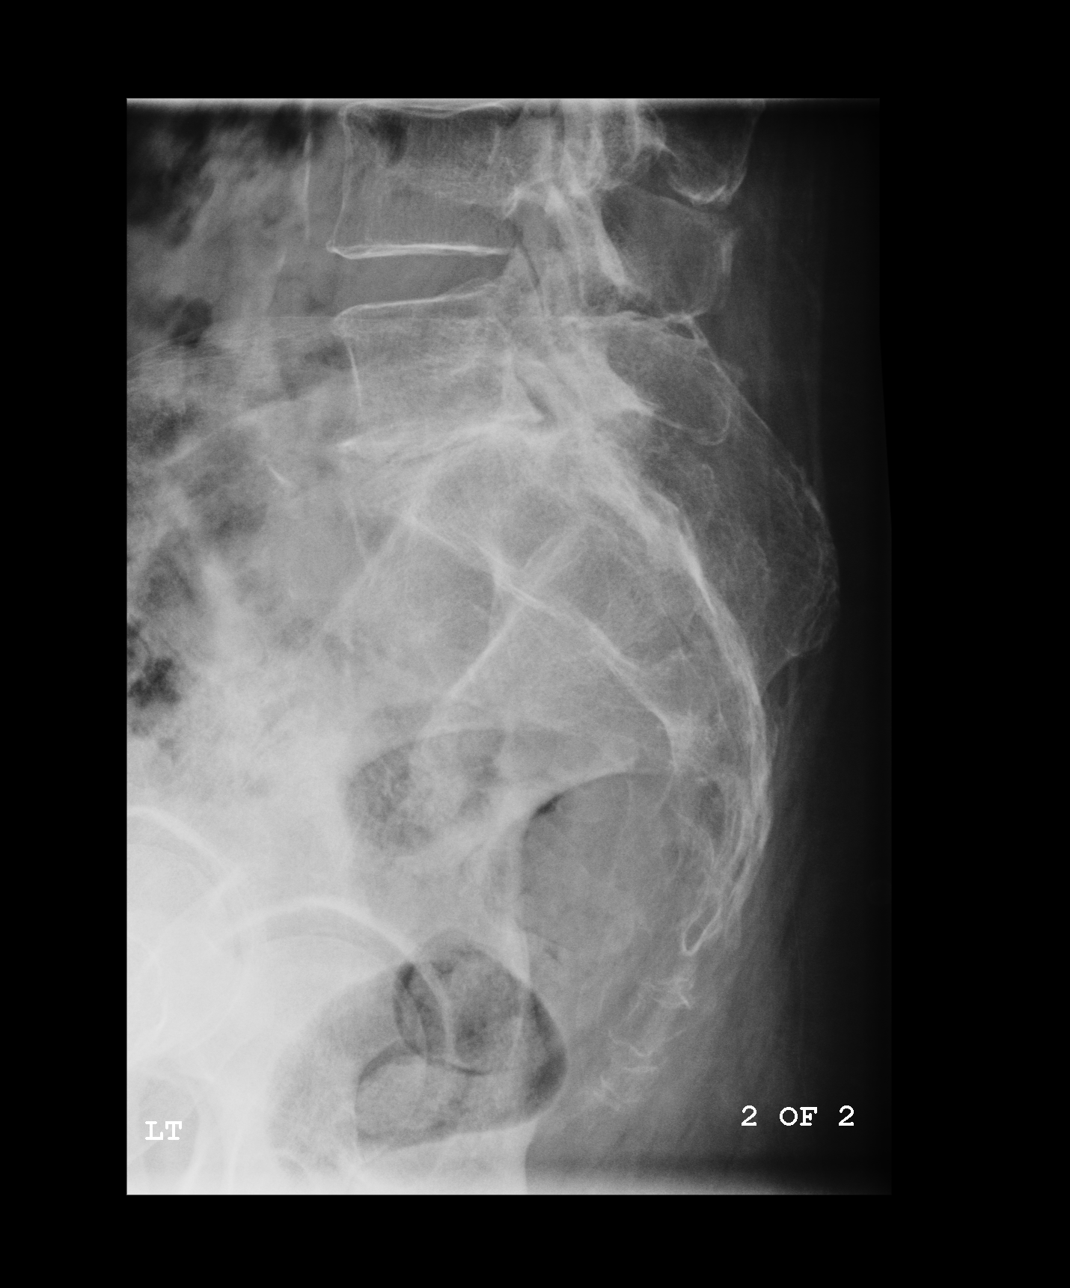

[4 of 4 positions shown; findings below may reference images not displayed]

FINDINGS: There 5 lumbar type vertebral bodies. Evidence of degenerative disease is
seen at L1-L2, L2-L3, and L5-S1. There is normal alignment. Minimal
age-indeterminate height loss seen of the T11 and T10 vertebral bodies.
Calcifications are seen abdominal aorta.
IMPRESSION: 1. Multilevel degenerative disc disease.
2. Minimal age-indeterminate height loss of T10 and T11 vertebral bodies.
Correlate with patient's site of pain.

## 2011-11-01 ENCOUNTER — Ambulatory Visit (INDEPENDENT_AMBULATORY_CARE_PROVIDER_SITE_OTHER): Payer: Medicare Other

## 2011-11-01 DIAGNOSIS — I4891 Unspecified atrial fibrillation: Secondary | ICD-10-CM

## 2011-11-01 DIAGNOSIS — Z7901 Long term (current) use of anticoagulants: Secondary | ICD-10-CM

## 2011-11-01 LAB — POCT INR: INR: 2.1

## 2011-12-08 ENCOUNTER — Other Ambulatory Visit: Payer: Self-pay | Admitting: Cardiovascular Disease

## 2011-12-08 NOTE — Telephone Encounter (Signed)
Please Review and Refill, Thank You. 

## 2011-12-13 ENCOUNTER — Ambulatory Visit (INDEPENDENT_AMBULATORY_CARE_PROVIDER_SITE_OTHER): Payer: Medicare Other

## 2011-12-13 DIAGNOSIS — I4891 Unspecified atrial fibrillation: Secondary | ICD-10-CM

## 2011-12-13 DIAGNOSIS — Z7901 Long term (current) use of anticoagulants: Secondary | ICD-10-CM

## 2012-01-10 ENCOUNTER — Ambulatory Visit (INDEPENDENT_AMBULATORY_CARE_PROVIDER_SITE_OTHER): Payer: Medicare Other

## 2012-01-10 DIAGNOSIS — Z7901 Long term (current) use of anticoagulants: Secondary | ICD-10-CM

## 2012-01-10 DIAGNOSIS — I4891 Unspecified atrial fibrillation: Secondary | ICD-10-CM

## 2012-01-10 LAB — POCT INR: INR: 2

## 2012-01-10 MED ORDER — WARFARIN SODIUM 2.5 MG PO TABS: ORAL_TABLET | ORAL | Status: DC

## 2012-02-08 ENCOUNTER — Ambulatory Visit (INDEPENDENT_AMBULATORY_CARE_PROVIDER_SITE_OTHER): Payer: Medicare Other

## 2012-02-08 DIAGNOSIS — Z7901 Long term (current) use of anticoagulants: Secondary | ICD-10-CM

## 2012-02-08 DIAGNOSIS — I4891 Unspecified atrial fibrillation: Secondary | ICD-10-CM

## 2012-02-08 LAB — POCT INR: INR: 2

## 2012-02-13 ENCOUNTER — Ambulatory Visit (INDEPENDENT_AMBULATORY_CARE_PROVIDER_SITE_OTHER): Payer: Medicare Other | Admitting: Cardiovascular Disease

## 2012-02-13 ENCOUNTER — Encounter: Payer: Self-pay | Admitting: Cardiovascular Disease

## 2012-02-13 VITALS — BP 148/70 | HR 69 | Ht 63.0 in | Wt 169.0 lb

## 2012-02-13 DIAGNOSIS — I10 Essential (primary) hypertension: Secondary | ICD-10-CM

## 2012-02-13 DIAGNOSIS — I359 Nonrheumatic aortic valve disorder, unspecified: Secondary | ICD-10-CM

## 2012-02-13 DIAGNOSIS — I2510 Atherosclerotic heart disease of native coronary artery without angina pectoris: Secondary | ICD-10-CM

## 2012-02-13 DIAGNOSIS — I5031 Acute diastolic (congestive) heart failure: Secondary | ICD-10-CM

## 2012-02-13 DIAGNOSIS — E119 Type 2 diabetes mellitus without complications: Secondary | ICD-10-CM

## 2012-02-13 DIAGNOSIS — I4891 Unspecified atrial fibrillation: Secondary | ICD-10-CM

## 2012-02-13 NOTE — Assessment & Plan Note (Signed)
No clinical signs of heart failure. We have suggested she continue to take Lasix as needed for edema, abdominal swelling, worsening shortness of breath. Prior echocardiogram showing elevated right ventricular systolic pressures. She's not very symptomatic and seems to have problems with incontinence.

## 2012-02-13 NOTE — Assessment & Plan Note (Signed)
We have encouraged continued exercise, careful diet management in an effort to lose weight. Weight is up 3 pounds from her prior clinic visit.

## 2012-02-13 NOTE — Patient Instructions (Addendum)
You are doing well. No medication changes were made. Keep taking lasix as needed for edema or shortness of breath  Ask primary care about tramadol for  knee pain  Please call us if you have new issues that need to be addressed before your next appt.  Your physician wants you to follow-up in: 6 months.  You will receive a reminder letter in the mail two months in advance. If you don't receive a letter, please call our office to schedule the follow-up appointment.

## 2012-02-13 NOTE — Assessment & Plan Note (Signed)
Blood pressure is well controlled on today's visit. No changes made to the medications. 

## 2012-02-13 NOTE — Assessment & Plan Note (Signed)
Currently with no symptoms of angina. No further workup at this time. Continue current medication regimen. 

## 2012-02-13 NOTE — Assessment & Plan Note (Signed)
Moderate aortic valve stenosis. Periodic echocardiogram to evaluate her valve, possibly again next year.

## 2012-02-13 NOTE — Progress Notes (Signed)
Patient ID: Annette Lyons, female    DOB: [birth year], 79 y.o.   MRN: [id number]  HPI Comments: Annette Lyons is a very pleasant 79 year old with paroxysmal atrial fibrillation, diabetes , moderate aortic valve stenosis, moderate pulmonary hypertension,  hypertension, diastolic heart failure who presents for routine followup.  She reports that overall she is doing well. Weight is up 3 pounds from her last clinic visit. she continues to have problems with her arthritis .   She denies any chest pain, neck or arm pain.  She has had no new SOB, PND or orthopnea.  she takes Lasix as needed for edema, not on a regular basis In the past, she has had frequent mode switching on pacer interrogation  EKG shows paced rhythm with rate 69 beats per minute   Outpatient Encounter Prescriptions as of 02/13/2012  Medication Sig Dispense Refill  . acetaminophen (TYLENOL) 500 MG tablet Take 500 mg by mouth as needed.      . ALPRAZolam (XANAX) 0.25 MG tablet Take 0.5 mg by mouth at bedtime as needed.      Marland Kitchen amLODipine-valsartan (EXFORGE) 5-320 MG per tablet Take 1 tablet by mouth daily.       . Cyanocobalamin (B-12 PO) Take 1,000 mcg by mouth daily.       . furosemide (LASIX) 20 MG tablet Take 20 mg by mouth as needed.       . metFORMIN (GLUCOPHAGE) 1000 MG tablet Take 1,000 mg by mouth 2 (two) times daily.        . metoprolol succinate (TOPROL-XL) 25 MG 24 hr tablet Take 1 tablet (25 mg total) by mouth daily.  30 tablet  11  . Multiple Vitamins-Minerals (PRESERVISION AREDS PO) Take by mouth daily.      Marland Kitchen omeprazole (PRILOSEC) 20 MG capsule Take 20 mg by mouth as needed.       . triamcinolone cream (KENALOG) 0.1 % Apply topically 2 (two) times daily as needed.      . warfarin (COUMADIN) 2.5 MG tablet Take as directed by anticoagulation clinic  50 tablet  3  . [DISCONTINUED] vitamin B-12 (CYANOCOBALAMIN) 1000 MCG tablet Take 1,000 mcg by mouth daily.         Review of Systems  Constitutional: Negative.   HENT:  Negative.   Eyes: Negative.   Respiratory: Negative.   Cardiovascular: Negative.   Gastrointestinal: Negative.   Musculoskeletal: Positive for joint swelling, arthralgias and gait problem.  Skin: Negative.   Neurological: Negative.   Hematological: Negative.   Psychiatric/Behavioral: Negative.   All other systems reviewed and are negative.    BP 148/70  Pulse 69  Ht 5\' 3"  (1.6 m)  Wt 169 lb (76.658 kg)  BMI 29.94 kg/m2  Physical Exam  Nursing note and vitals reviewed. Constitutional: She is oriented to person, place, and time. She appears well-developed and well-nourished.  HENT:  Head: Normocephalic.  Nose: Nose normal.  Mouth/Throat: Oropharynx is clear and moist.  Eyes: Conjunctivae normal are normal. Pupils are equal, round, and reactive to light.  Neck: Normal range of motion. Neck supple. No JVD present.  Cardiovascular: Normal rate, regular rhythm, S1 normal, S2 normal and intact distal pulses.  Exam reveals no gallop and no friction rub.   Murmur heard.  Crescendo systolic murmur is present with a grade of 2/6  Pulmonary/Chest: Effort normal and breath sounds normal. No respiratory distress. She has no wheezes. She has no rales. She exhibits no tenderness.  Abdominal: Soft. Bowel sounds are normal. She  exhibits no distension. There is no tenderness.  Musculoskeletal: Normal range of motion. She exhibits no edema and no tenderness.  Lymphadenopathy:    She has no cervical adenopathy.  Neurological: She is alert and oriented to person, place, and time. Coordination normal.  Skin: Skin is warm and dry. No rash noted. No erythema.  Psychiatric: She has a normal mood and affect. Her behavior is normal. Judgment and thought content normal.         Assessment and Plan

## 2012-02-21 ENCOUNTER — Ambulatory Visit: Payer: Medicare Other | Admitting: Internal Medicine

## 2012-03-11 ENCOUNTER — Encounter: Payer: Self-pay | Admitting: Internal Medicine

## 2012-03-11 ENCOUNTER — Ambulatory Visit (INDEPENDENT_AMBULATORY_CARE_PROVIDER_SITE_OTHER): Payer: Medicare Other | Admitting: Internal Medicine

## 2012-03-11 ENCOUNTER — Ambulatory Visit (INDEPENDENT_AMBULATORY_CARE_PROVIDER_SITE_OTHER)
Admission: RE | Admit: 2012-03-11 | Discharge: 2012-03-11 | Disposition: A | Discharge status: Home / Self Care | Payer: Medicare Other | Source: Ambulatory Visit | Attending: Internal Medicine | Admitting: Internal Medicine

## 2012-03-11 VITALS — BP 124/76 | HR 69 | Ht 63.5 in | Wt 170.0 lb

## 2012-03-11 DIAGNOSIS — J42 Unspecified chronic bronchitis: Secondary | ICD-10-CM

## 2012-03-11 MED ORDER — ALBUTEROL SULFATE (2.5 MG/3ML) 0.083% IN NEBU: 2.5000 mg | INHALATION_SOLUTION | Freq: Four times a day (QID) | RESPIRATORY_TRACT | Status: DC | PRN

## 2012-03-11 MED ORDER — PROMETHAZINE-CODEINE 6.25-10 MG/5ML PO SYRP: 2.5000 mL | ORAL_SOLUTION | Freq: Four times a day (QID) | ORAL | Status: DC | PRN

## 2012-03-11 MED ORDER — ALBUTEROL SULFATE HFA 108 (90 BASE) MCG/ACT IN AERS: 2.0000 | INHALATION_SPRAY | Freq: Four times a day (QID) | RESPIRATORY_TRACT | Status: DC | PRN

## 2012-03-11 NOTE — Progress Notes (Signed)
02/21/11- 87 yoF never smoker followed for chronic bronchitis/ asthma   Friend  here LOV -  07/15/09 Has had flu vax. Since last here had pacemaker placed/ Cardiology Dr Antoine Poche. Now lives in [place] at a retirement home and [facility] lost track of her so she needs to restart albuterol for nebulizer.  Very good year with no significant respiratory probs. Had bronchitis December- Dr N.Gates R/O'd flu, gave benzonatate 100 mg- minimal help. Asks codeine c.s. For sparing use. Still cough more than baseline, light wheeze,no sputum, fever, chest pain or palpitation.  03/11/12- 88 yoF never smoker followed for chronic bronchitis/ asthma, pacer/ coumadin FOLLOWS FOR: wakes up with fullness of phelgm; wheezing gets worse with windy weather Some mild wheeze only in windy weather, otherwise good. Ran out of her inhaler. Never filled prescription for cough syrup and asks replacement. Now on anticoagulant.  ROS-see HPI Constitutional:   No- acute  weight loss, night sweats, fevers, chills, fatigue, lassitude. HEENT:   No-  headaches, difficulty swallowing, tooth/dental problems, sore throat,       No-  sneezing, itching, ear ache, nasal congestion, post nasal drip,  CV:  No-   chest pain, orthopnea, PND, swelling in lower extremities, anasarca, dizziness, palpitations Resp: +Mild shortness of breath with exertion or at rest.              No- productive cough,  No non-productive cough,  No- coughing up of blood.              No-   change in color of mucus.  +Occasional mild wheezing.   Skin: No-   rash or lesions. GI:  No-   heartburn, indigestion, abdominal pain, nausea, vomiting,  GU: MS:  No- acute  joint pain or swelling,  Neuro-     nothing unusual Psych:  No- change in mood or affect. No depression or anxiety.  No memory loss.  OBJ General- Alert, Oriented, Affect-appropriate, Distress- none acute Skin- rash-none, lesions- none, excoriation- none Lymphadenopathy- none Head- atraumatic  Eyes- Gross vision intact, PERRLA, conjunctivae clear secretions            Ears- Hearing, canals-normal            Nose- Clear, no-Septal dev, mucus, polyps, erosion, perforation             Throat- Mallampati II , mucosa clear , drainage- none, tonsils- atrophic,+ mild throat clearing Neck- flexible , trachea midline, no stridor , thyroid nl, carotid no bruit Chest - symmetrical excursion , unlabored           Heart/CV- RRR , +2/6 systolic AS murmur , no gallop  , no rub, nl s1 s2                           - JVD- none , edema- trace, stasis changes- none, varices- none           Lung- clear to P&A, wheeze- none, cough- none , dullness-none, rub- none           Chest wall- +L pacemaker Abd-  Br/ Gen/ Rectal- Not done, not indicated Extrem- cyanosis- none, clubbing, none, atrophy- none, strength- nl Neuro- grossly intact to observation

## 2012-03-11 NOTE — Patient Instructions (Addendum)
Sample and script Proair HFA albuterol rescue inhaler     2 puffs  4 times daily if needed  Script for albuterol nebulizer solution. You can use this instead of using your rescue inhaler from time to time as needed  Script for cough syrup- use sparingly, may cause drowsiness  Order- CXR     Dx chronic bronchitis

## 2012-03-13 ENCOUNTER — Ambulatory Visit (INDEPENDENT_AMBULATORY_CARE_PROVIDER_SITE_OTHER): Payer: Medicare Other

## 2012-03-13 DIAGNOSIS — I4891 Unspecified atrial fibrillation: Secondary | ICD-10-CM

## 2012-03-13 DIAGNOSIS — Z7901 Long term (current) use of anticoagulants: Secondary | ICD-10-CM

## 2012-03-20 NOTE — Assessment & Plan Note (Signed)
Occasional mild cough and wheeze consistent with an asthma with bronchitis pattern, generally well controlled. Plan-refill prescription for nebulizer medication and for rescue inhaler. Chest x-ray.

## 2012-04-03 ENCOUNTER — Ambulatory Visit (INDEPENDENT_AMBULATORY_CARE_PROVIDER_SITE_OTHER): Payer: Medicare Other

## 2012-04-03 DIAGNOSIS — Z7901 Long term (current) use of anticoagulants: Secondary | ICD-10-CM

## 2012-04-03 DIAGNOSIS — I4891 Unspecified atrial fibrillation: Secondary | ICD-10-CM

## 2012-04-24 ENCOUNTER — Ambulatory Visit (INDEPENDENT_AMBULATORY_CARE_PROVIDER_SITE_OTHER): Payer: Medicare Other

## 2012-04-24 DIAGNOSIS — Z7901 Long term (current) use of anticoagulants: Secondary | ICD-10-CM

## 2012-04-24 DIAGNOSIS — I4891 Unspecified atrial fibrillation: Secondary | ICD-10-CM

## 2012-05-22 ENCOUNTER — Ambulatory Visit (INDEPENDENT_AMBULATORY_CARE_PROVIDER_SITE_OTHER): Payer: Medicare Other

## 2012-05-22 DIAGNOSIS — I4891 Unspecified atrial fibrillation: Secondary | ICD-10-CM

## 2012-05-22 DIAGNOSIS — Z7901 Long term (current) use of anticoagulants: Secondary | ICD-10-CM

## 2012-05-22 LAB — POCT INR: INR: 2.3

## 2012-07-03 ENCOUNTER — Ambulatory Visit (INDEPENDENT_AMBULATORY_CARE_PROVIDER_SITE_OTHER): Payer: Medicare Other

## 2012-07-03 DIAGNOSIS — I4891 Unspecified atrial fibrillation: Secondary | ICD-10-CM

## 2012-07-03 DIAGNOSIS — Z7901 Long term (current) use of anticoagulants: Secondary | ICD-10-CM

## 2012-07-03 LAB — POCT INR: INR: 3

## 2012-08-14 ENCOUNTER — Ambulatory Visit (INDEPENDENT_AMBULATORY_CARE_PROVIDER_SITE_OTHER): Payer: Medicare Other

## 2012-08-14 ENCOUNTER — Ambulatory Visit (INDEPENDENT_AMBULATORY_CARE_PROVIDER_SITE_OTHER): Payer: Medicare Other | Admitting: Cardiovascular Disease

## 2012-08-14 ENCOUNTER — Encounter: Payer: Self-pay | Admitting: Cardiovascular Disease

## 2012-08-14 VITALS — BP 142/70 | HR 66 | Ht 63.0 in | Wt 167.2 lb

## 2012-08-14 DIAGNOSIS — I10 Essential (primary) hypertension: Secondary | ICD-10-CM

## 2012-08-14 DIAGNOSIS — M79609 Pain in unspecified limb: Secondary | ICD-10-CM

## 2012-08-14 DIAGNOSIS — I4891 Unspecified atrial fibrillation: Secondary | ICD-10-CM

## 2012-08-14 DIAGNOSIS — E119 Type 2 diabetes mellitus without complications: Secondary | ICD-10-CM

## 2012-08-14 DIAGNOSIS — M79606 Pain in leg, unspecified: Secondary | ICD-10-CM | POA: Insufficient documentation

## 2012-08-14 DIAGNOSIS — I2510 Atherosclerotic heart disease of native coronary artery without angina pectoris: Secondary | ICD-10-CM

## 2012-08-14 DIAGNOSIS — I5031 Acute diastolic (congestive) heart failure: Secondary | ICD-10-CM

## 2012-08-14 DIAGNOSIS — R0602 Shortness of breath: Secondary | ICD-10-CM

## 2012-08-14 DIAGNOSIS — I359 Nonrheumatic aortic valve disorder, unspecified: Secondary | ICD-10-CM

## 2012-08-14 DIAGNOSIS — Z7901 Long term (current) use of anticoagulants: Secondary | ICD-10-CM

## 2012-08-14 MED ORDER — AMLODIPINE BESYLATE 5 MG PO TABS: 5.0000 mg | ORAL_TABLET | Freq: Every day | ORAL | Status: DC

## 2012-08-14 MED ORDER — VALSARTAN 320 MG PO TABS: 320.0000 mg | ORAL_TABLET | Freq: Every day | ORAL | Status: DC

## 2012-08-14 NOTE — Assessment & Plan Note (Signed)
The cost of her exforge is high. We will change to amlodipine 5 mg daily with valsartan 320 mg daily and separate pills

## 2012-08-14 NOTE — Assessment & Plan Note (Signed)
Currently with no symptoms of angina. No further workup at this time. Continue current medication regimen. 

## 2012-08-14 NOTE — Assessment & Plan Note (Signed)
Moderate aortic valve stenosis and 2013. Repeat ultrasound in 2015

## 2012-08-14 NOTE — Assessment & Plan Note (Signed)
No significant shortness of breath or edema on today's visit. She'll continue to take Lasix when necessary.

## 2012-08-14 NOTE — Progress Notes (Signed)
Patient ID: Annette Lyons, female    DOB: August 14, 1923, 79 y.o.   MRN: [id number]  HPI Comments: Annette Lyons is a very pleasant 79 year old with paroxysmal atrial fibrillation, diabetes , moderate aortic valve stenosis, moderate pulmonary hypertension,  hypertension, diastolic heart failure who presents for routine followup.  She reports that overall she is doing well. She reports that her legs are weak but stable. She uses a walker. She has significant neuropathy in her legs around her feet and lower extremities. She is currently participating in physical therapy at [facility]. She also has problems with her back. She takes Lasix periodically for leg swelling.  She denies any chest pain, neck or arm pain.  She has had no new SOB, PND or orthopnea.   In the past, she has had frequent mode switching on pacer interrogation  EKG shows paced rhythm with rate 66 beats per minute   Outpatient Encounter Prescriptions as of 08/14/2012  Medication Sig Dispense Refill  . acetaminophen (TYLENOL) 500 MG tablet Take 500 mg by mouth as needed.      Marland Kitchen albuterol (PROVENTIL HFA;VENTOLIN HFA) 108 (90 BASE) MCG/ACT inhaler Inhale 2 puffs into the lungs every 6 (six) hours as needed for wheezing or shortness of breath.  1 Inhaler  prn  . albuterol (PROVENTIL) (2.5 MG/3ML) 0.083% nebulizer solution Take 3 mLs (2.5 mg total) by nebulization every 6 (six) hours as needed for wheezing or shortness of breath.  75 mL  12  . Cyanocobalamin (B-12 PO) Take 1,000 mcg by mouth daily.       . furosemide (LASIX) 20 MG tablet Take 20 mg by mouth as needed.       . metFORMIN (GLUCOPHAGE) 1000 MG tablet Take 1,000 mg by mouth 2 (two) times daily.        . metoprolol succinate (TOPROL-XL) 25 MG 24 hr tablet Take 1 tablet (25 mg total) by mouth daily.  30 tablet  11  . Multiple Vitamins-Minerals (PRESERVISION AREDS PO) Take by mouth daily.      Marland Kitchen omeprazole (PRILOSEC) 20 MG capsule Take 20 mg by mouth as needed.       . warfarin  (COUMADIN) 2.5 MG tablet Take as directed by anticoagulation clinic  50 tablet  3  . [DISCONTINUED] amLODipine-valsartan (EXFORGE) 5-320 MG per tablet Take 1 tablet by mouth daily.       Marland Kitchen amLODipine (NORVASC) 5 MG tablet Take 1 tablet (5 mg total) by mouth daily.  30 tablet  6  . valsartan (DIOVAN) 320 MG tablet Take 1 tablet (320 mg total) by mouth daily.  30 tablet  6    Review of Systems  Constitutional: Negative.   HENT: Negative.   Eyes: Negative.   Respiratory: Negative.   Cardiovascular: Positive for leg swelling.  Gastrointestinal: Negative.   Musculoskeletal: Positive for joint swelling, arthralgias and gait problem.  Skin: Negative.   Neurological: Negative.   Psychiatric/Behavioral: Negative.   All other systems reviewed and are negative.    BP 142/70  Pulse 66  Ht 5\' 3"  (1.6 m)  Wt 167 lb 4 oz (75.864 kg)  BMI 29.63 kg/m2  Physical Exam  Nursing note and vitals reviewed. Constitutional: She is oriented to person, place, and time. She appears well-developed and well-nourished.  HENT:  Head: Normocephalic.  Nose: Nose normal.  Mouth/Throat: Oropharynx is clear and moist.  Eyes: Conjunctivae are normal. Pupils are equal, round, and reactive to light.  Neck: Normal range of motion. Neck supple. No JVD  present.  Cardiovascular: Normal rate, regular rhythm, S1 normal, S2 normal and intact distal pulses.  Exam reveals no gallop and no friction rub.   Murmur heard.  Crescendo systolic murmur is present with a grade of 2/6  Pulmonary/Chest: Effort normal and breath sounds normal. No respiratory distress. She has no wheezes. She has no rales. She exhibits no tenderness.  Abdominal: Soft. Bowel sounds are normal. She exhibits no distension. There is no tenderness.  Musculoskeletal: Normal range of motion. She exhibits no edema and no tenderness.  Lymphadenopathy:    She has no cervical adenopathy.  Neurological: She is alert and oriented to person, place, and time.  Coordination normal.  Skin: Skin is warm and dry. No rash noted. No erythema.  Psychiatric: She has a normal mood and affect. Her behavior is normal. Judgment and thought content normal.    Assessment and Plan

## 2012-08-14 NOTE — Patient Instructions (Addendum)
You are doing well.  Ask Dr. Kevan Ny about cymbalta or lyrica for leg pain/neuropathy  When you run out of exforge, start amlodipine 5 mg daily and valsartan 320 mg daily  Please call us if you have new issues that need to be addressed before your next appt.  Your physician wants you to follow-up in: 6 months.  You will receive a reminder letter in the mail two months in advance. If you don't receive a letter, please call our office to schedule the follow-up appointment.

## 2012-08-14 NOTE — Assessment & Plan Note (Signed)
She reports having neuropathy in her feet. We have suggested she talk with her primary care physician about trying Cymbalta or lyrica.

## 2012-08-14 NOTE — Assessment & Plan Note (Signed)
On warfarin. No recent falls or signs of bleeding.

## 2012-08-14 NOTE — Assessment & Plan Note (Signed)
We have encouraged continued exercise, careful diet management in an effort to lose weight. 

## 2012-08-16 ENCOUNTER — Other Ambulatory Visit: Payer: Self-pay

## 2012-08-16 MED ORDER — WARFARIN SODIUM 2.5 MG PO TABS: ORAL_TABLET | ORAL | Status: AC

## 2012-08-16 NOTE — Telephone Encounter (Signed)
Please review for refill.  

## 2012-08-16 NOTE — Telephone Encounter (Signed)
Pt is completely out

## 2012-08-20 ENCOUNTER — Encounter: Payer: Self-pay | Admitting: *Deleted

## 2012-08-20 NOTE — Telephone Encounter (Signed)
This encounter was created in error - please disregard.

## 2012-09-09 ENCOUNTER — Ambulatory Visit: Payer: Medicare Other | Admitting: Internal Medicine

## 2012-09-12 ENCOUNTER — Ambulatory Visit (INDEPENDENT_AMBULATORY_CARE_PROVIDER_SITE_OTHER): Payer: Medicare Other | Admitting: *Deleted

## 2012-09-12 DIAGNOSIS — I4891 Unspecified atrial fibrillation: Secondary | ICD-10-CM

## 2012-09-12 LAB — PACEMAKER DEVICE OBSERVATION
AL AMPLITUDE: 1.5 mv
AL IMPEDENCE PM: 437.5 Ohm
BAMS-0001: 150 {beats}/min
BATTERY VOLTAGE: 2.9328 V
RV LEAD IMPEDENCE PM: 412.5 Ohm
VENTRICULAR PACING PM: 100

## 2012-09-12 NOTE — Progress Notes (Signed)
PPM check in office. 

## 2012-09-22 ENCOUNTER — Other Ambulatory Visit: Payer: Self-pay | Admitting: Internal Medicine

## 2012-09-25 ENCOUNTER — Ambulatory Visit (INDEPENDENT_AMBULATORY_CARE_PROVIDER_SITE_OTHER): Payer: Medicare Other | Admitting: *Deleted

## 2012-09-25 DIAGNOSIS — Z7901 Long term (current) use of anticoagulants: Secondary | ICD-10-CM

## 2012-09-25 DIAGNOSIS — I4891 Unspecified atrial fibrillation: Secondary | ICD-10-CM

## 2012-10-09 ENCOUNTER — Other Ambulatory Visit: Payer: Self-pay

## 2012-10-09 ENCOUNTER — Ambulatory Visit (INDEPENDENT_AMBULATORY_CARE_PROVIDER_SITE_OTHER): Payer: Medicare Other

## 2012-10-09 ENCOUNTER — Telehealth: Payer: Self-pay

## 2012-10-09 DIAGNOSIS — I4891 Unspecified atrial fibrillation: Secondary | ICD-10-CM

## 2012-10-09 DIAGNOSIS — Z7901 Long term (current) use of anticoagulants: Secondary | ICD-10-CM

## 2012-10-09 DIAGNOSIS — I2510 Atherosclerotic heart disease of native coronary artery without angina pectoris: Secondary | ICD-10-CM

## 2012-10-09 LAB — POCT INR: INR: 4.7

## 2012-10-09 NOTE — Telephone Encounter (Signed)
Would like for you to call her back regarding referral for pt

## 2012-10-10 NOTE — Telephone Encounter (Signed)
Spoke with Baxter Hire regarding referral on patient. Patient is scheduled to see Dr. Herbert Deaner in September 2014.

## 2012-11-13 ENCOUNTER — Ambulatory Visit: Payer: Self-pay

## 2012-11-13 DIAGNOSIS — I4891 Unspecified atrial fibrillation: Secondary | ICD-10-CM

## 2012-11-13 DIAGNOSIS — Z7901 Long term (current) use of anticoagulants: Secondary | ICD-10-CM

## 2012-12-02 ENCOUNTER — Telehealth: Payer: Self-pay | Admitting: Internal Medicine

## 2012-12-02 NOTE — Telephone Encounter (Signed)
ATC pt and line would d/c after 4 rings. i tried calling pt 3 times [facility]

## 2012-12-03 MED ORDER — ALBUTEROL SULFATE (2.5 MG/3ML) 0.083% IN NEBU: 2.5000 mg | INHALATION_SOLUTION | Freq: Four times a day (QID) | RESPIRATORY_TRACT | Status: AC | PRN

## 2012-12-03 MED ORDER — PROMETHAZINE-CODEINE 6.25-10 MG/5ML PO SYRP: 5.0000 mL | ORAL_SOLUTION | Freq: Four times a day (QID) | ORAL | Status: AC | PRN

## 2012-12-03 MED ORDER — ALBUTEROL SULFATE HFA 108 (90 BASE) MCG/ACT IN AERS: 2.0000 | INHALATION_SPRAY | Freq: Four times a day (QID) | RESPIRATORY_TRACT | Status: AC | PRN

## 2012-12-03 NOTE — Telephone Encounter (Signed)
Albuterol inhaler and neb sent to pharmacy. Cough syrup rx printed and mailed to pt. Pt is aware. Carron Curie, CMA New address: [address] [place], [place] [address]

## 2012-12-03 NOTE — Telephone Encounter (Addendum)
Last OV 03/11/12, next OV-none. Pt states she has moved to [place] and has not established with an MD there at this time. She states over the last few days she has been having increased productive cough with thick clear phlegm and wheezing. She states she has run out of her albuterol nebulizer medication, albuterol inhaler, and phenergan cough syrup. She is requesting a refill on all three medications. Pt is aware that the cough syrup cannot be called in and she requests this be mailed. Last refill for cough syrup was on 02/21/12. Please advise. Carron Curie, CMA Allergies  Allergen Reactions  . Penicillins      Current Outpatient Prescriptions on File Prior to Visit  Medication Sig Dispense Refill  . acetaminophen (TYLENOL) 500 MG tablet Take 500 mg by mouth as needed.      Marland Kitchen albuterol (PROVENTIL HFA;VENTOLIN HFA) 108 (90 BASE) MCG/ACT inhaler Inhale 2 puffs into the lungs every 6 (six) hours as needed for wheezing or shortness of breath.  1 Inhaler  prn  . albuterol (PROVENTIL) (2.5 MG/3ML) 0.083% nebulizer solution Take 3 mLs (2.5 mg total) by nebulization every 6 (six) hours as needed for wheezing or shortness of breath.  75 mL  12  . amLODipine-valsartan (EXFORGE) 5-320 MG per tablet Take 1 tablet by mouth daily.      . Cyanocobalamin (B-12 PO) Take 1,000 mcg by mouth daily.       . furosemide (LASIX) 20 MG tablet Take 20 mg by mouth as needed.       . metFORMIN (GLUCOPHAGE) 1000 MG tablet Take 1,000 mg by mouth 2 (two) times daily.        . metoprolol succinate (TOPROL-XL) 25 MG 24 hr tablet Take 1 tablet (25 mg total) by mouth daily.  30 tablet  11  . metoprolol succinate (TOPROL-XL) 25 MG 24 hr tablet TAKE 1 TABLET BY MOUTH EVERY DAY  90 tablet  0  . Multiple Vitamins-Minerals (PRESERVISION AREDS PO) Take by mouth daily.      Marland Kitchen omeprazole (PRILOSEC) 20 MG capsule Take 20 mg by mouth as needed.       . warfarin (COUMADIN) 2.5 MG tablet Take as directed by anticoagulation clinic  60  tablet  3   No current facility-administered medications on file prior to visit.   [facility] [address] [place]

## 2012-12-03 NOTE — Telephone Encounter (Signed)
Per CY-okay to refill medications and mail to patient.

## 2013-02-10 ENCOUNTER — Other Ambulatory Visit: Payer: Self-pay | Admitting: Cardiovascular Disease

## 2013-02-10 NOTE — Telephone Encounter (Signed)
Please review and refill, Thank you. 

## 2013-02-10 NOTE — Telephone Encounter (Signed)
Pt  No longer followed by our clinic.  [facility].  Rx denied

## 2013-02-20 ENCOUNTER — Encounter: Payer: Self-pay | Admitting: Cardiology

## 2015-01-14 ENCOUNTER — Encounter: Payer: Self-pay | Admitting: Internal Medicine

## 2019-03-10 DEATH — deceased
